# Patient Record
Sex: Female | Born: 2005 | Hispanic: No | Marital: Single | Smoking: Never smoker
Health system: Southern US, Community
[De-identification: ages and names within clinical notes are randomized; demographics above are authoritative.]

---
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
## 2024-05-10 ENCOUNTER — Ambulatory Visit (HOSPITAL_COMMUNITY)
Admission: EM | Admit: 2024-05-10 | Discharge: 2024-05-12 | Disposition: A | Discharge status: Other Acute Inpatient Hospital | Source: Home / Self Care

## 2024-05-10 DIAGNOSIS — F23 Brief psychotic disorder: Secondary | ICD-10-CM | POA: Insufficient documentation

## 2024-05-10 DIAGNOSIS — E669 Obesity, unspecified: Secondary | ICD-10-CM | POA: Diagnosis not present

## 2024-05-10 DIAGNOSIS — F32A Depression, unspecified: Secondary | ICD-10-CM | POA: Diagnosis not present

## 2024-05-10 DIAGNOSIS — Z79899 Other long term (current) drug therapy: Secondary | ICD-10-CM | POA: Insufficient documentation

## 2024-05-10 DIAGNOSIS — R440 Auditory hallucinations: Secondary | ICD-10-CM

## 2024-05-10 DIAGNOSIS — F29 Unspecified psychosis not due to a substance or known physiological condition: Secondary | ICD-10-CM

## 2024-05-10 DIAGNOSIS — R45851 Suicidal ideations: Secondary | ICD-10-CM | POA: Diagnosis not present

## 2024-05-10 DIAGNOSIS — F69 Unspecified disorder of adult personality and behavior: Secondary | ICD-10-CM

## 2024-05-10 DIAGNOSIS — F2081 Schizophreniform disorder: Secondary | ICD-10-CM | POA: Diagnosis not present

## 2024-05-10 MED ORDER — OLANZAPINE 5 MG PO TABS
5.0000 mg | ORAL_TABLET | ORAL | Status: AC
  Administered 2024-05-11: 5 mg via ORAL
  Filled 2024-05-10: qty 1

## 2024-05-10 MED ORDER — MAGNESIUM HYDROXIDE 400 MG/5ML PO SUSP: 30.0000 mL | Freq: Every day | ORAL | Status: DC | PRN

## 2024-05-10 MED ORDER — HALOPERIDOL 5 MG PO TABS: 5.0000 mg | ORAL_TABLET | Freq: Three times a day (TID) | ORAL | Status: DC | PRN

## 2024-05-10 MED ORDER — OLANZAPINE 10 MG IM SOLR: 5.0000 mg | Freq: Three times a day (TID) | INTRAMUSCULAR | Status: DC | PRN

## 2024-05-10 MED ORDER — DIPHENHYDRAMINE HCL 50 MG PO CAPS: 50.0000 mg | ORAL_CAPSULE | Freq: Three times a day (TID) | ORAL | Status: DC | PRN

## 2024-05-10 MED ORDER — ALUM & MAG HYDROXIDE-SIMETH 200-200-20 MG/5ML PO SUSP: 30.0000 mL | ORAL | Status: DC | PRN

## 2024-05-10 MED ORDER — OLANZAPINE 10 MG IM SOLR: 10.0000 mg | Freq: Three times a day (TID) | INTRAMUSCULAR | Status: DC | PRN

## 2024-05-10 MED ORDER — ACETAMINOPHEN 325 MG PO TABS: 650.0000 mg | ORAL_TABLET | Freq: Four times a day (QID) | ORAL | Status: DC | PRN

## 2024-05-10 NOTE — ED Provider Notes (Signed)
 [facility] Continuous Assessment Admission H&P  Date: 05/10/24 Patient Name: Patricia Branch MRN: [id number] Chief Complaint: auditory hallucination   Diagnoses:  Final diagnoses:  Auditory hallucinations  Brief reactive psychosis (HCC)  Behavior concern in adult    HPI: Patricia Branch, 18 y/o female with no confirm psychiatric diagnosis.  Presented to [facility] [facility] by her father [name]).  Patient is observed in the interview room sitting in the chair, patient scanning the room and talking to herself.  When asked a question patient stated I have this problem I cannot talk because they will not let me talk, patient went on to say that I have been getting spirits and my body and the demons are talking to me but they will not allow me to talk sometimes.  When asked any questions patient would talk in the third person and at times patient was states do not talk to him do not tell him anything.  Patient obviously has experienced some brief psychosis and auditory hallucination patient is even responding to internal stimuli.  At one point patient stated there are a lot of  things going on in the neighborhood and I cannot talk about it.  They will not let me talk.  Collateral : Per patient's father patient has been acting like this for the past 30 days or so at first he stated that they did not understand what was going on and he thought that the patient was acting out based on something they see on to talk or Instagram but it became more apparent that the patient is hallucinating at times and the patient keep talking more about demons and devils.  According to the patient's father patient is currently not seeing a psychiatrist or therapist and is not diagnosed with anything right now.  Per the patient's father he did know that the patient's mother was thinking about taking the patient to a doctor but he is not sure if they did follow up with that appointment.  Patient currently lives at home with  mother and father and other siblings.   Face-to-face observation of patient, patient is alert and oriented to person and place and time.  Patient is fairly groomed.  Patient is currently hallucinating and responding to internal stimuli patient can be observed scanning the room and talking to herself and when asked questions patient would talk in the third person's voice  do not talk to him do not say anything.  Patient currently denies SI, HI.  According to patient she does not drink or smoke.  Patient does not appear to be in any immediate distress does not show signs of wanting to hurt herself.  However patient is influenced by internal stimuli.  Writer discussed with both patient and her father that we will admit her so we can conduct further evaluation and assessment.  Father and patient was given the opportunity to ask questions questions were answered.  recommending patient admission when a bed becomes available for now patient will stay in the observation unit Total Time spent with patient: 30 minutes  Musculoskeletal  Strength & Muscle Tone: within normal limits Gait & Station: normal Patient leans: N/A  Psychiatric Specialty Exam  Presentation General Appearance:  Casual  Eye Contact: Good  Speech: Clear and Coherent  Speech Volume: Decreased  Handedness: Right   Mood and Affect  Mood: Dysphoric  Affect: Flat; Labile   Thought Process  Thought Processes: Linear  Descriptions of Associations:Loose  Orientation:Full (Time, Place and Person)  Thought  Content:Paranoid Ideation  Diagnosis of Schizophrenia or Schizoaffective disorder in past: No   Hallucinations:Hallucinations: Auditory Description of Auditory Hallucinations: According to the patient she is hearing demons talking to her  Ideas of Reference:Paranoia  Suicidal Thoughts:Suicidal Thoughts: No  Homicidal Thoughts:Homicidal Thoughts: No   Sensorium  Memory: Immediate  Fair  Judgment: Poor  Insight: Poor   Executive Functions  Concentration: Poor  Attention Span: Fair  Recall: Fair  Fund of Knowledge: Fair  Language: Fair   Psychomotor Activity  Psychomotor Activity: Psychomotor Activity: Normal   Assets  Assets: Desire for Improvement   Sleep  Sleep: Sleep: Fair Number of Hours of Sleep: 6   Nutritional Assessment (For OBS and FBC admissions only) Has the patient had a weight loss or gain of 10 pounds or more in the last 3 months?: No Has the patient had a decrease in food intake/or appetite?: No Does the patient have dental problems?: No Does the patient have eating habits or behaviors that may be indicators of an eating disorder including binging or inducing vomiting?: No Has the patient recently lost weight without trying?: 0 Has the patient been eating poorly because of a decreased appetite?: 0 Malnutrition Screening Tool Score: 0    Physical Exam HENT:     Head: Normocephalic.     Nose: Nose normal.  Eyes:     Pupils: Pupils are equal, round, and reactive to light.  Cardiovascular:     Rate and Rhythm: Normal rate.  Pulmonary:     Effort: Pulmonary effort is normal.  Musculoskeletal:        General: Normal range of motion.     Cervical back: Normal range of motion.  Neurological:     General: No focal deficit present.     Mental Status: She is alert.  Psychiatric:        Mood and Affect: Mood normal.        Behavior: Behavior normal.        Thought Content: Thought content normal.        Judgment: Judgment normal.    Review of Systems  Constitutional: Negative.   HENT: Negative.    Eyes: Negative.   Respiratory: Negative.    Cardiovascular: Negative.   Gastrointestinal: Negative.   Genitourinary: Negative.   Musculoskeletal: Negative.   Skin: Negative.   Neurological: Negative.   Psychiatric/Behavioral:  Positive for hallucinations. The patient is nervous/anxious.     There were no  vitals taken for this visit. There is no height or weight on file to calculate BMI.  Past Psychiatric History: Auditory hallucination  Is the patient at risk to self? No  Has the patient been a risk to self in the past 6 months? No .    Has the patient been a risk to self within the distant past? No   Is the patient a risk to others? No   Has the patient been a risk to others in the past 6 months? No   Has the patient been a risk to others within the distant past? No   Past Medical History: See chart  Family History: Unknown  Social History: Denies  Last Labs:  No visits with results within 6 Month(s) from this visit.  Latest known visit with results is:  Admission on 06/04/2020, Discharged on 06/04/2020  Component Date Value Ref Range Status   Lipase 06/03/2020 24  11 - 51 U/L Final   Performed at [facility], [address], [place], [place] [address]  Sodium 06/03/2020 138  135 - 145 mmol/L Final   Potassium 06/03/2020 3.4 (L)  3.5 - 5.1 mmol/L Final   Chloride 06/03/2020 100  98 - 111 mmol/L Final   CO2 06/03/2020 27  22 - 32 mmol/L Final   Glucose, Bld 06/03/2020 89  70 - 99 mg/dL Final   Glucose reference range applies only to samples taken after fasting for at least 8 hours.   BUN 06/03/2020 14  4 - 18 mg/dL Final   Creatinine, Ser 06/03/2020 0.69  0.50 - 1.00 mg/dL Final   Calcium 91/68/7978 8.9  8.9 - 10.3 mg/dL Final   Total Protein 91/68/7978 7.7  6.5 - 8.1 g/dL Final   Albumin 91/68/7978 3.9  3.5 - 5.0 g/dL Final   AST 91/68/7978 17  15 - 41 U/L Final   ALT 06/03/2020 11  0 - 44 U/L Final   Alkaline Phosphatase 06/03/2020 72  50 - 162 U/L Final   Total Bilirubin 06/03/2020 0.7  0.3 - 1.2 mg/dL Final   GFR calc non Af Amer 06/03/2020 NOT CALCULATED  >60 mL/min Final   GFR calc Af Amer 06/03/2020 NOT CALCULATED  >60 mL/min Final   Anion gap 06/03/2020 11  5 - 15 Final   Performed at [facility], [address] [place], [place] [address]    WBC 06/03/2020 10.5  4.5 - 13.5 K/uL Final   RBC 06/03/2020 4.91  3.80 - 5.20 MIL/uL Final   Hemoglobin 06/03/2020 10.5 (L)  11.0 - 14.6 g/dL Final   HCT 91/68/7978 34.2  33.0 - 44.0 % Final   MCV 06/03/2020 69.7 (L)  77.0 - 95.0 fL Final   MCH 06/03/2020 21.4 (L)  25.0 - 33.0 pg Final   MCHC 06/03/2020 30.7 (L)  31.0 - 37.0 g/dL Final   RDW 91/68/7978 17.2 (H)  11.3 - 15.5 % Final   Platelets 06/03/2020 433 (H)  150 - 400 K/uL Final   nRBC 06/03/2020 0.0  0.0 - 0.2 % Final   Performed at [facility], [address] [place], [place] [address]   Troponin I (High Sensitivity) 06/03/2020 3  <18 ng/L Final   Comment: (NOTE) Elevated high sensitivity troponin I (hsTnI) values and significant  changes across serial measurements may suggest ACS but many other  chronic and acute conditions are known to elevate hsTnI results.  Refer to the Links section for chest pain algorithms and additional  guidance. Performed at [facility], [address] [place], [place] [address]     Allergies: Patient has no known allergies.  Medications:     Medical Decision Making  Observation unit    Recommendations  Based on my evaluation the patient does not appear to have an emergency medical condition.  Gaither Pouch, NP 05/10/24  10:36 PM

## 2024-05-10 NOTE — Progress Notes (Signed)
   05/10/24 2050  [facility] Triage Screening (Walk-ins at [facility] only)  How Did You Hear About Us ? Family/Friend  What Is the Reason for Your Visit/Call Today? Patricia Branch is a 18 year old female presenting as a voluntary walk-in to [facility] due to SI and hallucinations. Patient denied HI and alcohol/drug usage. Patient reports today wanted to attempt suicide, its like I have moments of where I am just going to kill myself, the demons don't want me to tell you. Patient states there are demons telling me to hurt people, they don't want me to talk, they are walking around my neighborhood possessing people, I got possessed today, they want you to think I am crazy, but I am not. Patient is responding to internal stimuli looking around the room stating they don't want me to talk. Patient reports compliance to psych medication, however, she feels the medication is not working.  How Long Has This Been Causing You Problems? 1 wk - 1 month  Have You Recently Had Any Thoughts About Hurting Yourself? Yes  How long ago did you have thoughts about hurting yourself? today  Are You Planning to Commit Suicide/Harm Yourself At This time? Yes (don't know)  Have you Recently Had Thoughts About Hurting Someone [place]? No  Are You Planning To Harm Someone At This Time? No  Physical Abuse Denies  Verbal Abuse Denies  Sexual Abuse Denies  Exploitation of patient/patient's resources Denies  Self-Neglect Denies  Possible abuse reported to:  (n/a)  Are you currently experiencing any auditory, visual or other hallucinations? Yes  Please explain the hallucinations you are currently experiencing: hearing and seeing demons  Have You Used Any Alcohol or Drugs in the Past 24 Hours? No  Do you have any current medical co-morbidities that require immediate attention? No  Clinician description of patient physical appearance/behavior: neat / cooperative  What Do You Feel Would Help You the Most Today? Treatment for Depression or  other mood problem  If access to [facility] was not available, would you have sought care in the Emergency Department? Yes  Determination of Need Urgent (48 hours)  Options For Referral Inpatient Hospitalization;Medication Management;BH Urgent Care;Outpatient Therapy  Determination of Need filed? Yes    Flowsheet Row ED from 05/10/2024 in [facility]  C-SSRS RISK CATEGORY High Risk

## 2024-05-10 NOTE — BH Assessment (Signed)
 Comprehensive Clinical Assessment (CCA) Note  05/10/2024 Patricia Branch [id number]  Disposition: Gaither Pouch, NP, recommends inpatient treatment. AC at [facility] to review for bed placement.   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 Acute risk factors for suicide include: N/A. Protective factors for this patient include: responsibility to others (children, family) and hope for the future. Considering these factors, the overall suicide risk at this point appears to be moderate. Patient is not appropriate for outpatient follow up.  Patricia Branch is a 18 year old female presenting as a voluntary walk-in to [facility] due to SI and hallucinations. Patient denied HI and alcohol/drug usage. Patient was brought in by her father, Patricia Branch. Patient reports today wanted to attempt suicide, its like I have moments of where I am just going to kill myself, the demons don't want me to tell you. Patient states there are demons telling me to hurt people, they don't want me to talk, they are walking around my neighborhood possessing people, I got possessed today, they want you to think I am crazy, but I am not. Patient is responding to internal stimuli looking around the room stating they don't want me to talk. Patient denied having a therapist and a psychiatrist. Patient reports she does receive psych medications from her a doctor, maybe a PCP. Patient feels that her medications are not working. Patient denied prior psych hospitalizations, suicide attempts and self-harming behaviors. Patient resides with mother, father and 2 siblings. Patient is unemployed. Patient denied access to guns. Patient was cooperative during assessment.  Chief Complaint:  Chief Complaint  Patient presents with   Delusional   Visit Diagnosis:  Hallucinations    CCA Screening, Triage and Referral (STR)  Patient Reported  Information How did you hear about us ? Family/Friend  What Is the Reason for Your Visit/Call Today? Patricia Branch is a 18 year old female presenting as a voluntary walk-in to [facility] due to SI and hallucinations. Patient denied HI and alcohol/drug usage. Patient reports today wanted to attempt suicide, its like I have moments of where I am just going to kill myself, the demons don't want me to tell you. Patient states there are demons telling me to hurt people, they don't want me to talk, they are walking around my neighborhood possessing people, I got possessed today, they want you to think I am crazy, but I am not. Patient is responding to internal stimuli looking around the room stating they don't want me to talk. Patient reports compliance to psych medication, however, she feels the medication is not working.  How Long Has This Been Causing You Problems? 1 wk - 1 month  What Do You Feel Would Help You the Most Today? Treatment for Depression or other mood problem   Have You Recently Had Any Thoughts About Hurting Yourself? Yes  Are You Planning to Commit Suicide/Harm Yourself At This time? Yes (don't know)   Flowsheet Row ED from 05/10/2024 in [facility]  C-SSRS RISK CATEGORY High Risk    Have you Recently Had Thoughts About Hurting Someone Patricia Branch? No  Are You Planning to Harm Someone at This Time? No  Explanation: n/a   Have You Used Any Alcohol or Drugs in the Past 24 Hours? No  How Long Ago Did You Use Drugs or Alcohol? N/a What Did You Use and How Much? N/a  Do You Currently Have a Therapist/Psychiatrist? No (states PCP prescribes psych  medications)  Name of Therapist/Psychiatrist:  n/a  Have You Been Recently Discharged From Any Office Practice or Programs? No  Explanation of Discharge From Practice/Program: n/a    CCA Screening Triage Referral Assessment Type of Contact: Face-to-Face  Telemedicine Service Delivery:  n/a Is this  Initial or Reassessment?  N/a Date Telepsych consult ordered in [facility]:   N/a Time Telepsych consult ordered in [facility]:   N/a Location of Assessment: [facility] Assessment Services  Provider Location: [facility] Assessment Services   Collateral Involvement: none   Does Patient Have a Automotive engineer Guardian? No  Legal Guardian Contact Information: n/a  Copy of Legal Guardianship Form: -- (n/a)  Legal Guardian Notified of Arrival: -- (n/a)  Legal Guardian Notified of Pending Discharge: -- (n/a)  If Minor and Not Living with Parent(s), Who has Custody? n/a  Is CPS involved or ever been involved? Never  Is APS involved or ever been involved? Never   Patient Determined To Be At Risk for Harm To Self or Others Based on Review of Patient Reported Information or Presenting Complaint? Yes, for Self-Harm  Method: Plan with intent and identified person  Availability of Means: No access or NA  Intent: Vague intent or NA  Notification Required: No need or identified person  Additional Information for Danger to Others Potential: Active psychosis  Additional Comments for Danger to Others Potential: n/a  Are There Guns or Other Weapons in Your Home? No  Types of Guns/Weapons: n/a  Are These Weapons Safely Secured?                            -- (n/a)  Who Could Verify You Are Able To Have These Secured: n/a  Do You Have any Outstanding Charges, Pending Court Dates, Parole/Probation? none reported  Contacted To Inform of Risk of Harm To Self or Others: Family/Significant Other:   Does Patient Present under Involuntary Commitment? No   [place] of Residence: [place]   Patient Currently Receiving the Following Services: Not Receiving Services   Determination of Need: Urgent (48 hours)   Options For Referral: Inpatient Hospitalization; Medication Management; BH Urgent Care; Outpatient Therapy   CCA Biopsychosocial Patient Reported Schizophrenia/Schizoaffective Diagnosis in  Past: No   Strengths: uta   Mental Health Symptoms Depression:  Hopelessness; Difficulty Concentrating   Duration of Depressive symptoms: Duration of Depressive Symptoms: -- (uta)   Mania:  None   Anxiety:   Worrying; Tension; Restlessness; Difficulty concentrating   Psychosis:  Hallucinations   Duration of Psychotic symptoms: Duration of Psychotic Symptoms: N/A   Trauma:  -- (uta)   Obsessions:  Recurrent & persistent thoughts/impulses/images   Compulsions:  None   Inattention:  None   Hyperactivity/Impulsivity:  None   Oppositional/Defiant Behaviors:  None   Emotional Irregularity:  None   Other Mood/Personality Symptoms:  n/a    Mental Status Exam Appearance and self-care  Stature:  Average   Weight:  Average weight   Clothing:  Age-appropriate   Grooming:  Normal   Cosmetic use:  None   Posture/gait:  Normal   Motor activity:  Not Remarkable   Sensorium  Attention:  Normal   Concentration:  Preoccupied   Orientation:  X5   Recall/memory:  Normal   Affect and Mood  Affect:  Anxious   Mood:  Anxious   Relating  Eye contact:  Normal   Facial expression:  Anxious   Attitude toward examiner:  Cooperative   Thought  and Language  Speech flow: Normal   Thought content:  Delusions   Preoccupation:  Other (Comment)   Hallucinations:  Auditory; Visual   Organization:  Disorganized   Company secretary of Knowledge:  Average   Intelligence:  Average   Abstraction:  Functional   Judgement:  Impaired   Reality Testing:  Distorted   Insight:  Lacking   Decision Making:  Impulsive   Social Functioning  Social Maturity:  Impulsive   Social Judgement:  Naive   Stress  Stressors:  Other (Comment) (demons)   Coping Ability:  Overwhelmed; Exhausted   Skill Deficits:  Self-control; Decision making   Supports:  Family     Religion: Religion/Spirituality Are You A Religious Person?: No How Might This Affect  Treatment?: n/a  Leisure/Recreation: Leisure / Recreation Do You Have Hobbies?: Yes Leisure and Hobbies: drawing and researching information online  Exercise/Diet: Exercise/Diet Do You Exercise?: No Have You Gained or Lost A Significant Amount of Weight in the Past Six Months?: No Do You Follow a Special Diet?: No Do You Have Any Trouble Sleeping?:  (uta)   CCA Employment/Education Employment/Work Situation: Employment / Work Situation Employment Situation: Unemployed Patient's Job has Been Impacted by Current Illness: No Has Patient ever Been in Equities trader?: No  Education: Education Is Patient Currently Attending School?: No Last Grade Completed: 12 Did You Product manager?: No Did You Have An Individualized Education Program (IIEP): No Did You Have Any Difficulty At [employer]?: No Patient's Education Has Been Impacted by Current Illness: No   CCA Family/Childhood History Family and Relationship History: Family history Marital status: Single Does patient have children?: No  Childhood History:  Childhood History By whom was/is the patient raised?: Mother, Father Did patient suffer any verbal/emotional/physical/sexual abuse as a child?:  (uta) Did patient suffer from severe childhood neglect?:  (uta) Has patient ever been sexually abused/assaulted/raped as an adolescent or adult?:  (uta) Was the patient ever a victim of a crime or a disaster?:  (uta) Witnessed domestic violence?:  (uta) Has patient been affected by domestic violence as an adult?:  (uta)   CCA Substance Use Alcohol/Drug Use: Alcohol / Drug Use Pain Medications: see MAR Prescriptions: see MAR Over the Counter: see MAR History of alcohol / drug use?: No history of alcohol / drug abuse Longest period of sobriety (when/how long): n/a Negative Consequences of Use:  (n/a) Withdrawal Symptoms:  (n/a)     ASAM's:  Six Dimensions of Multidimensional Assessment  Dimension 1:  Acute Intoxication and/or  Withdrawal Potential:   Dimension 1:  Description of individual's past and current experiences of substance use and withdrawal: n/a  Dimension 2:  Biomedical Conditions and Complications:   Dimension 2:  Description of patient's biomedical conditions and  complications: n/a  Dimension 3:  Emotional, Behavioral, or Cognitive Conditions and Complications:  Dimension 3:  Description of emotional, behavioral, or cognitive conditions and complications: n/a  Dimension 4:  Readiness to Change:  Dimension 4:  Description of Readiness to Change criteria: n/a  Dimension 5:  Relapse, Continued use, or Continued Problem Potential:  Dimension 5:  Relapse, continued use, or continued problem potential critiera description: n/a  Dimension 6:  Recovery/Living Environment:  Dimension 6:  Recovery/Iiving environment criteria description: n/a  ASAM Severity Score:    ASAM Recommended Level of Treatment: ASAM Recommended Level of Treatment:  (n/a)   Substance use Disorder (SUD) Substance Use Disorder (SUD)  Checklist Symptoms of Substance Use:  (n/a)  Recommendations for Services/Supports/Treatments: Recommendations  for Services/Supports/Treatments Recommendations For Services/Supports/Treatments: Inpatient Hospitalization, Individual Therapy, Medication Management, Other (Comment)  Disposition Recommendation per psychiatric provider:  Recommends inpatient psychiatric treatment.    DSM5 Diagnoses: There are no active problems to display for this patient.    Referrals to Alternative Service(s): Referred to Alternative Service(s):   Place:   Date:   Time:    Referred to Alternative Service(s):   Place:   Date:   Time:    Referred to Alternative Service(s):   Place:   Date:   Time:    Referred to Alternative Service(s):   Place:   Date:   Time:     Patricia Branch, [facility]

## 2024-05-11 ENCOUNTER — Other Ambulatory Visit: Payer: Self-pay

## 2024-05-11 LAB — COMPREHENSIVE METABOLIC PANEL WITH GFR
ALT: 18 U/L (ref 0–44)
AST: 20 U/L (ref 15–41)
Albumin: 3.6 g/dL (ref 3.5–5.0)
Alkaline Phosphatase: 70 U/L (ref 38–126)
Anion gap: 13 (ref 5–15)
BUN: 12 mg/dL (ref 6–20)
CO2: 25 mmol/L (ref 22–32)
Calcium: 9.4 mg/dL (ref 8.9–10.3)
Chloride: 105 mmol/L (ref 98–111)
Creatinine, Ser: 0.76 mg/dL (ref 0.44–1.00)
GFR, Estimated: >60 mL/min (ref >60)
Glucose, Bld: 77 mg/dL (ref 70–99)
Potassium: 4.1 mmol/L (ref 3.5–5.1)
Sodium: 143 mmol/L (ref 135–145)
Total Bilirubin: 0.8 mg/dL (ref 0.0–1.2)
Total Protein: 7.4 g/dL (ref 6.5–8.1)

## 2024-05-11 LAB — CBC WITH DIFFERENTIAL/PLATELET
Abs Immature Granulocytes: 0.02 K/uL (ref 0.00–0.07)
Basophils Absolute: 0.1 K/uL (ref 0.0–0.1)
Basophils Relative: 1 %
Eosinophils Absolute: 0.1 K/uL (ref 0.0–0.5)
Eosinophils Relative: 1 %
HCT: 35.8 % — ABNORMAL LOW (ref 36.0–46.0)
Hemoglobin: 10.9 g/dL — ABNORMAL LOW (ref 12.0–15.0)
Immature Granulocytes: 0 %
Lymphocytes Relative: 20 %
Lymphs Abs: 2 K/uL (ref 0.7–4.0)
MCH: 22.3 pg — ABNORMAL LOW (ref 26.0–34.0)
MCHC: 30.4 g/dL (ref 30.0–36.0)
MCV: 73.2 fL — ABNORMAL LOW (ref 80.0–100.0)
Monocytes Absolute: 0.8 K/uL (ref 0.1–1.0)
Monocytes Relative: 9 %
Neutro Abs: 6.7 K/uL (ref 1.7–7.7)
Neutrophils Relative %: 69 %
Platelets: 449 K/uL — ABNORMAL HIGH (ref 150–400)
RBC: 4.89 MIL/uL (ref 3.87–5.11)
RDW: 16.9 % — ABNORMAL HIGH (ref 11.5–15.5)
WBC: 9.6 K/uL (ref 4.0–10.5)
nRBC: 0 % (ref 0.0–0.2)

## 2024-05-11 LAB — POCT URINE DRUG SCREEN - MANUAL ENTRY (I-SCREEN)
POC Amphetamine UR: NOT DETECTED
POC Buprenorphine (BUP): NOT DETECTED
POC Cocaine UR: NOT DETECTED
POC Marijuana UR: NOT DETECTED
POC Methadone UR: NOT DETECTED
POC Methamphetamine UR: NOT DETECTED
POC Morphine: NOT DETECTED
POC Oxazepam (BZO): NOT DETECTED
POC Oxycodone UR: NOT DETECTED
POC Secobarbital (BAR): NOT DETECTED

## 2024-05-11 LAB — POC URINE PREG, ED: Preg Test, Ur: NEGATIVE

## 2024-05-11 LAB — ETHANOL: Alcohol, Ethyl (B): <15 mg/dL (ref <15)

## 2024-05-11 LAB — TSH: TSH: 3.795 u[IU]/mL (ref 0.350–4.500)

## 2024-05-11 MED ORDER — OLANZAPINE 5 MG PO TBDP
5.0000 mg | ORAL_TABLET | Freq: Two times a day (BID) | ORAL | Status: DC
  Administered 2024-05-11: 5 mg via ORAL
  Filled 2024-05-11: qty 1

## 2024-05-11 MED ORDER — OLANZAPINE 5 MG PO TBDP
5.0000 mg | ORAL_TABLET | Freq: Once | ORAL | Status: AC
  Administered 2024-05-11: 5 mg via ORAL
  Filled 2024-05-11: qty 1

## 2024-05-11 NOTE — Progress Notes (Addendum)
 Pt is actively responding to internal stimuli and gazing at the ceiling while taking to self. No signs of acute distress noted. Staff will monitor for pt's safety.

## 2024-05-11 NOTE — Progress Notes (Signed)
 Pt is awoke and alert with delay responses. Pt did not voice any complaints of pain or discomfort. No signs of acute distress noted. Pt endorses passive SI,no plan or intent. Pt reported that the voices in her head tell her to kill herself and sometimes tell her to kill others. Pt was advised to notify staff when having overwhelming thoughts to to hurt self or others. Pt verbalized understanding. Staff will monitor for pt's safety.

## 2024-05-11 NOTE — ED Notes (Signed)
 Attempted to get labs from pt, pt refused.

## 2024-05-11 NOTE — ED Notes (Signed)
 Pt was cooperative on arrival to the unit. Pt appeared to be internally preoccupied, had delayed responses to some of the questions. Pt said she came in because she had SI and was hearing voices of the demons that she claims possessed her today. Pt said the demons are going around possessing people. Pt's skin assessment was done, no significant findings noted. She was oriented to the unit and her belongings secured per hospital protocol. Staff will continue to monitor pt for safety.

## 2024-05-11 NOTE — ED Provider Notes (Signed)
 [facility] ASAP Discharge Summary  Date and Time: 05/11/2024 9:37 AM  Name: Patricia Branch  MRN:  [id number]   Discharge Diagnoses:  Final diagnoses:  Auditory hallucinations  Brief reactive psychosis (HCC)  Behavior concern in adult   HPI: Patricia Branch is a 18 y.o. female with no significant past psychiatric history who presents with auditory hallucinations and ideas of reference about spirits and demons possessing her body.  Subjective:  Patient reports that she is still experiencing a lot of hallucinations.  Patient denies any substance use which corresponded with hide hallucinations.  Patient reports that she feels overwhelmed by the voices and that they are lying to her and telling her things that she does not agree with.  Patient was agreeable to going to inpatient psychiatry.  Stay Summary: Patient was observed to be having conversations with herself.  Patient appeared somewhat paranoid around others.  Labs came back unremarkable except low hemoglobin 11 consistent with previous baseline.  Patient consented to voluntary inpatient treatment.  Total Time spent with patient: 30 minutes  Past Psychiatric History: No formal past psychiatric history Past Medical History: No pertinent history Family History: No pertinent Family Psychiatric History: No pertinent Social History: Lives with parents in [place], further history limited by patient's psychosis Tobacco Cessation:  N/A, patient does not currently use tobacco products  Current Medications:  Current Facility-Administered Medications  Medication Dose Route Frequency Provider Last Rate Last Admin   acetaminophen  (TYLENOL ) tablet 650 mg  650 mg Oral Q6H PRN Trudy Carwin, NP       alum & mag hydroxide-simeth (MAALOX/MYLANTA) 200-200-20 MG/5ML suspension 30 mL  30 mL Oral Q4H PRN Trudy Carwin, NP       haloperidol  (HALDOL ) tablet 5 mg  5 mg Oral TID PRN Trudy Carwin, NP       And   diphenhydrAMINE  (BENADRYL ) capsule 50 mg  50 mg  Oral TID PRN Trudy Carwin, NP       magnesium  hydroxide (MILK OF MAGNESIA) suspension 30 mL  30 mL Oral Daily PRN Trudy Carwin, NP       OLANZapine  (ZYPREXA ) injection 10 mg  10 mg Intramuscular TID PRN Trudy Carwin, NP       OLANZapine  (ZYPREXA ) injection 5 mg  5 mg Intramuscular TID PRN Trudy Carwin, NP       Current Outpatient Medications  Medication Sig Dispense Refill   Lurasidone HCl 60 MG TABS Take 60 mg by mouth at bedtime.     amphetamine-dextroamphetamine (ADDERALL XR) 15 MG 24 hr capsule Take 15 mg by mouth daily. (Patient not taking: Reported on 05/11/2024)     clonazePAM (KLONOPIN) 0.5 MG tablet Take 0.5 mg by mouth daily. (Patient not taking: Reported on 05/11/2024)      PTA Medications:  Facility Ordered Medications  Medication   acetaminophen  (TYLENOL ) tablet 650 mg   alum & mag hydroxide-simeth (MAALOX/MYLANTA) 200-200-20 MG/5ML suspension 30 mL   magnesium  hydroxide (MILK OF MAGNESIA) suspension 30 mL   haloperidol  (HALDOL ) tablet 5 mg   And   diphenhydrAMINE  (BENADRYL ) capsule 50 mg   OLANZapine  (ZYPREXA ) injection 5 mg   OLANZapine  (ZYPREXA ) injection 10 mg   [COMPLETED] OLANZapine  (ZYPREXA ) tablet 5 mg   PTA Medications  Medication Sig   Lurasidone HCl 60 MG TABS Take 60 mg by mouth at bedtime.   amphetamine-dextroamphetamine (ADDERALL XR) 15 MG 24 hr capsule Take 15 mg by mouth daily. (Patient not taking: Reported on 05/11/2024)   clonazePAM (KLONOPIN) 0.5 MG tablet Take 0.5 mg by  mouth daily. (Patient not taking: Reported on 05/11/2024)        No data to display          Flowsheet Row ED from 05/10/2024 in [facility]  C-SSRS RISK CATEGORY High Risk    Musculoskeletal  Strength & Muscle Tone: within normal limits Gait & Station: normal Patient leans: N/A  Psychiatric Specialty Exam  Presentation  General Appearance:  Casual  Eye Contact: Good  Speech: Clear and Coherent  Speech  Volume: Decreased  Handedness: Right   Mood and Affect  Mood: Dysphoric  Affect: Flat; Labile   Thought Process  Thought Processes: Linear  Descriptions of Associations:Loose  Orientation:Full (Time, Place and Person)  Thought Content:Paranoid Ideation  Diagnosis of Schizophrenia or Schizoaffective disorder in past: No    Hallucinations:Hallucinations: Auditory Description of Auditory Hallucinations: According to the patient she is hearing demons talking to her  Ideas of Reference:Paranoia  Suicidal Thoughts:Suicidal Thoughts: No  Homicidal Thoughts:Homicidal Thoughts: No   Sensorium  Memory: Immediate Fair  Judgment: Poor  Insight: Poor   Executive Functions  Concentration: Poor  Attention Span: Fair  Recall: Fair  Fund of Knowledge: Fair  Language: Fair   Psychomotor Activity  Psychomotor Activity: Psychomotor Activity: Normal   Assets  Assets: Desire for Improvement   Sleep  Sleep: Sleep: Fair  No Safety Checks orders active in given range  Nutritional Assessment (For OBS and [facility] admissions only) Has the patient had a weight loss or gain of 10 pounds or more in the last 3 months?: No Has the patient had a decrease in food intake/or appetite?: No Does the patient have dental problems?: No Does the patient have eating habits or behaviors that may be indicators of an eating disorder including binging or inducing vomiting?: No Has the patient recently lost weight without trying?: 0 Has the patient been eating poorly because of a decreased appetite?: 0 Malnutrition Screening Tool Score: 0    Physical Exam  Physical Exam Vitals and nursing note reviewed.  Constitutional:      General: She is not in acute distress. HENT:     Head: Normocephalic and atraumatic.  Pulmonary:     Effort: Pulmonary effort is normal.  Neurological:     Mental Status: She is alert.     Comments: No gross cranial nerve deficits. Strength  symmetric and equal and all 4 extremities. Gait normal when walking. No stiffness with passive ROS. Further exam limited by psychosis and not following directions.   Psychiatric:     Comments: No obvious EPS.      Review of Systems  Constitutional:  Negative for fever.  Cardiovascular:  Negative for chest pain and palpitations.  Gastrointestinal:  Negative for constipation, diarrhea, nausea and vomiting.  Neurological:  Negative for dizziness, weakness and headaches.  Psychiatric/Behavioral:         Pt denies extrapyramidal symptoms including dystonia (sudden spastic contractions of muscle groups), parkinsonism (bradykinesia, tremors, rigidity), and akathisia (severe restlessness).    Blood pressure 131/70, pulse 70, temperature 97.6 F (36.4 C), resp. rate 17, SpO2 100%. There is no height or weight on file to calculate BMI.  Demographic Factors:  Adolescent or young adult  Loss Factors: NA  Historical Factors: NA  Risk Reduction Factors:   Living with another person, especially a relative and Positive social support  Continued Clinical Symptoms:  Psychosis which began about month ago.   Cognitive Features That Contribute To Risk:  None    Suicide Risk:  Minimal: No identifiable suicidal ideation.  Patients presenting with no risk factors and no significant depressive symptoms. Pt is experiencing ego dystonic hallucinations and delusions around spirits and demons being in her but do not feel that this contributes to significant suicide risk and pt has good social support at home and is recommend for inpatient psychiatry to stabilize.    Plan Of Care/Follow-up recommendations:  Jordane Hisle is a 18 y.o. female with no past psychiatric history presenting with first episode psychosis including auditory hallucinations, delusions of spirits and demons in her body, and paranoid ideation.  Pt was trying to leave [facility] due to her AH telling her to leave, so we will continue with IVC  for patient safety as she is unable care for self and would be imminent harm to self if leaving hospital with appropriate workup and treatment.  CMP unremarkable. CBC low hemoglobin and recommend follow up with PCP. Given first episode psychosis, doing additional labs to rule out lesser common medical etiology with neuropsychiatric manifestations. Given normal neuro exam does not need transfer to ED for CT scan currently but recommend this outpatient if indicated. Please continue to perform neurologic exams to rule out any medical causes. First episode labs were determined based on Pediatric guidelines for psychosis in adolescent / young adults (reference below). Labs included CBC, CMP, B12, folate, ESR, ANA, RPR, HIV. Discuss with family any concerns for heavy metal exposure or family history of wilsons disease or other severe genetic conditions. Pt will transfer to inpatient psychiatry this evening. Holding home lurasidone in favor of high potency antipsychotic but reasonable to transition back to this medication. Pt could benefit from long acting injectable. Also holding adderal as this could worsen/trigger psychosis, although she has been on for a long time. Patient was initiated on olanzapine  5 mg at bedtime without side effects and will continue this for now until assessed at hospital and discussing options with pt when more stable.   Disposition: [facility] pending bed availability this evening.   Justino Cornish, MD PGY-2 Psychiatry Resident 05/11/2024, 9:54 AM

## 2024-05-11 NOTE — Discharge Instructions (Addendum)
 Follow-up recommendations:  Activity:  Normal, as tolerated Diet:  Per PCP recommendation  Patient is instructed prior to discharge to:  Take all medications as prescribed by mental healthcare provider. Report any adverse effects and/or reactions from the medicines to outpatient provider promptly. To not engage in substance use while on psychiatric medicines.  In the event of worsening symptoms, patient is instructed to call the crisis hotline at 988, 911, or go to the nearest ED for appropriate evaluation and treatment of symptoms. To follow-up with primary care provider for your other medical issues, concerns and, or healthcare needs.

## 2024-05-12 ENCOUNTER — Inpatient Hospital Stay (HOSPITAL_COMMUNITY)
Admission: AD | Admit: 2024-05-12 | Discharge: 2024-05-25 | DRG: 885 | Disposition: A | Discharge status: Home / Self Care | Source: Intra-hospital | Attending: Psychiatry | Admitting: Psychiatry

## 2024-05-12 ENCOUNTER — Encounter (HOSPITAL_COMMUNITY): Payer: Self-pay

## 2024-05-12 DIAGNOSIS — R45851 Suicidal ideations: Secondary | ICD-10-CM | POA: Diagnosis present

## 2024-05-12 DIAGNOSIS — F32A Depression, unspecified: Secondary | ICD-10-CM | POA: Diagnosis present

## 2024-05-12 DIAGNOSIS — R630 Anorexia: Secondary | ICD-10-CM | POA: Diagnosis present

## 2024-05-12 DIAGNOSIS — Z68.41 Body mass index (BMI) pediatric, greater than or equal to 95th percentile for age: Secondary | ICD-10-CM

## 2024-05-12 DIAGNOSIS — F29 Unspecified psychosis not due to a substance or known physiological condition: Secondary | ICD-10-CM | POA: Diagnosis not present

## 2024-05-12 DIAGNOSIS — E669 Obesity, unspecified: Secondary | ICD-10-CM | POA: Diagnosis present

## 2024-05-12 DIAGNOSIS — F419 Anxiety disorder, unspecified: Secondary | ICD-10-CM | POA: Diagnosis present

## 2024-05-12 DIAGNOSIS — Z79899 Other long term (current) drug therapy: Secondary | ICD-10-CM

## 2024-05-12 DIAGNOSIS — F23 Brief psychotic disorder: Secondary | ICD-10-CM | POA: Diagnosis present

## 2024-05-12 DIAGNOSIS — W1830XA Fall on same level, unspecified, initial encounter: Secondary | ICD-10-CM | POA: Diagnosis not present

## 2024-05-12 DIAGNOSIS — F2081 Schizophreniform disorder: Secondary | ICD-10-CM | POA: Diagnosis present

## 2024-05-12 DIAGNOSIS — F909 Attention-deficit hyperactivity disorder, unspecified type: Secondary | ICD-10-CM | POA: Diagnosis present

## 2024-05-12 DIAGNOSIS — Z818 Family history of other mental and behavioral disorders: Secondary | ICD-10-CM | POA: Diagnosis not present

## 2024-05-12 MED ORDER — ACETAMINOPHEN 325 MG PO TABS
650.0000 mg | ORAL_TABLET | Freq: Four times a day (QID) | ORAL | Status: DC | PRN
  Filled 2024-05-12: qty 2

## 2024-05-12 MED ORDER — DIPHENHYDRAMINE HCL 50 MG/ML IJ SOLN: 50.0000 mg | Freq: Three times a day (TID) | INTRAMUSCULAR | Status: DC | PRN

## 2024-05-12 MED ORDER — HALOPERIDOL LACTATE 5 MG/ML IJ SOLN
5.0000 mg | Freq: Three times a day (TID) | INTRAMUSCULAR | Status: DC | PRN
  Administered 2024-05-15 (×2): 5 mg via INTRAMUSCULAR
  Filled 2024-05-12: qty 1

## 2024-05-12 MED ORDER — ALUM & MAG HYDROXIDE-SIMETH 200-200-20 MG/5ML PO SUSP: 30.0000 mL | ORAL | Status: DC | PRN

## 2024-05-12 MED ORDER — HALOPERIDOL 5 MG PO TABS: 5.0000 mg | ORAL_TABLET | Freq: Three times a day (TID) | ORAL | Status: DC | PRN

## 2024-05-12 MED ORDER — MAGNESIUM HYDROXIDE 400 MG/5ML PO SUSP: 30.0000 mL | Freq: Every day | ORAL | Status: DC | PRN

## 2024-05-12 MED ORDER — LORAZEPAM 2 MG/ML IJ SOLN
2.0000 mg | Freq: Three times a day (TID) | INTRAMUSCULAR | Status: DC | PRN
  Administered 2024-05-13: 2 mg via INTRAMUSCULAR

## 2024-05-12 MED ORDER — HALOPERIDOL LACTATE 5 MG/ML IJ SOLN
10.0000 mg | Freq: Three times a day (TID) | INTRAMUSCULAR | Status: DC | PRN
  Administered 2024-05-13: 10 mg via INTRAMUSCULAR
  Filled 2024-05-12: qty 2

## 2024-05-12 MED ORDER — ARIPIPRAZOLE 10 MG PO TABS
10.0000 mg | ORAL_TABLET | Freq: Every day | ORAL | Status: DC
  Filled 2024-05-12: qty 1

## 2024-05-12 MED ORDER — LORAZEPAM 2 MG/ML IJ SOLN
2.0000 mg | Freq: Three times a day (TID) | INTRAMUSCULAR | Status: DC | PRN
  Filled 2024-05-12: qty 1

## 2024-05-12 MED ORDER — DIPHENHYDRAMINE HCL 50 MG/ML IJ SOLN
50.0000 mg | Freq: Three times a day (TID) | INTRAMUSCULAR | Status: DC | PRN
  Administered 2024-05-13 – 2024-05-15 (×3): 50 mg via INTRAMUSCULAR
  Filled 2024-05-12 (×2): qty 1

## 2024-05-12 MED ORDER — TRAZODONE HCL 50 MG PO TABS
50.0000 mg | ORAL_TABLET | Freq: Every evening | ORAL | Status: DC | PRN
  Administered 2024-05-18 – 2024-05-21 (×3): 50 mg via ORAL
  Filled 2024-05-12 (×3): qty 1

## 2024-05-12 MED ORDER — HYDROXYZINE HCL 25 MG PO TABS
25.0000 mg | ORAL_TABLET | Freq: Three times a day (TID) | ORAL | Status: DC | PRN
  Administered 2024-05-18 – 2024-05-19 (×2): 25 mg via ORAL
  Filled 2024-05-12 (×3): qty 1

## 2024-05-12 MED ORDER — ARIPIPRAZOLE 15 MG PO TABS: 15.0000 mg | ORAL_TABLET | Freq: Every day | ORAL | Status: DC

## 2024-05-12 MED ORDER — DIPHENHYDRAMINE HCL 25 MG PO CAPS: 50.0000 mg | ORAL_CAPSULE | Freq: Three times a day (TID) | ORAL | Status: DC | PRN

## 2024-05-12 NOTE — Progress Notes (Signed)
 Patient ID: Patricia Branch, female   DOB: 28-Jun-2006, 18 y.o.   MRN: [id number] D: Pt here IVC from [facility]. Pt denies HI and pain at this time. Pt endorses passive SI without a plan and contracts for safety at [facility]. Pt endorses AVH of hearing and seeing demons. Pt states that she has been hearing voices since she moved into the neighborhood about a year ago. Pt says that she sees demons possessing her neighbors and other people. They are possessing me. They don't want me to talk. Wait a minute, they are trying to take over me now. Pt is tapping on the table and twisting her head saying that she is hearing a demonic voice trying to possess her. Pt states the demons told her to kill her mother. Then they told her to go to the hospital instead.   Pt then begins to answer questions in the third person. Pt asked suicide assessment questions and responded, We wanted her to kill herself because we don't like her and want her to go to hell. We think she was going to take pills. She has some pills. We don't think she was going to do it.  Pt disorganized with rapid, tangential speech. Stops speaking mid sentence because she says she is being possessed and trying to fight it. Poor historian. Alert but only oriented to situation and person. Pt confused because voices are speaking to her. Denies drug, alcohol and tobacco use. States a doctor prescribed a medication for her that she took once. Could be lurasidone.  Pt states that the demons have been causing her to faint and fall down. Denies hitting her head or any other injury.  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 Lives with her parents and siblings. Reports a difficult relationship with family. Says she didn't complete high school. Unemployed.   States this is her first psych admission.   A: Pt was offered support and encouragement. Pt is cooperative during assessment. VS assessed and admission paperwork signed. Belongings searched and contraband  items placed in locker. Non-invasive skin search completed: abrasions to front lower leg. Pt says she scratches herself. Pt offered food and drink and both accepted. Pt introduced to unit milieu by nursing staff. Q 15 minute checks were started for safety.   R: Pt in room. Pt safety maintained on unit.

## 2024-05-12 NOTE — Plan of Care (Addendum)
 Pt noted sitting on bed on initial interactions, animated with inappropriate laughter at intervals, brief eye contact, disorganized and tangential speech. Pt continues to report being endorsed by demons, talks in the 3rd person phrases. Denies SI, HI and pain They're fine. They can't tell you that. Continues to endorse +AVH when engaged but will not elaborate further. Rates her anger and depression 0/10 and anxiety 8/10 with stressor being current mental state I'm worried about these psychotic episodes. Something in my body won't let me talk right now. They can't talk about anything you're asking. They're controlling me. Noted to be paranoid as well. Pt declined Abilify , spat it in cup despite multiple attempts I can't, they say you're trying to poison me. I don't want it Pt refused all meals and supplements when offered They don't want it. They can't eat because they will choke and die.   Problem: Safety: Goal: Periods of time without injury will increase Outcome: Progressing   Problem: Health Behavior/Discharge Planning: Goal: Compliance with treatment plan for underlying cause of condition will improve Outcome: Not Progressing

## 2024-05-12 NOTE — [facility] Suicide Risk Assessment (Signed)
 [facility] Admission Suicide Risk Assessment   Nursing information obtained from:  Patient Demographic factors:  Unemployed, Adolescent or young adult Current Mental Status:  Suicidal ideation indicated by patient Loss Factors:  NA Historical Factors:  NA Risk Reduction Factors:  Living with another person, especially a relative, Positive social support, Religious beliefs about death  Total Time spent with patient: 45 minutes Principal Problem: Acute psychosis (HCC) Diagnosis:  Principal Problem:   Acute psychosis (HCC)  Subjective Data: see H&P  Continued Clinical Symptoms:  Alcohol Use Disorder Identification Test Final Score (AUDIT): 0 The Alcohol Use Disorders Identification Test, Guidelines for Use in Primary Care, Second Edition.  World Science writer [facility]). Score between 0-7:  no or low risk or alcohol related problems. Score between 8-15:  moderate risk of alcohol related problems. Score between 16-19:  high risk of alcohol related problems. Score 20 or above:  warrants further diagnostic evaluation for alcohol dependence and treatment.   CLINICAL FACTORS:   Currently Psychotic Unstable or Poor Therapeutic Relationship   Musculoskeletal: Strength & Muscle Tone: within normal limits Gait & Station: normal Patient leans: N/A  Psychiatric Specialty Exam:  Presentation  General Appearance:  Bizarre; Disheveled  Eye Contact: Fleeting  Speech: Blocked  Speech Volume: Decreased  Handedness: Right   Mood and Affect  Mood: Anxious; Depressed  Affect: Constricted   Thought Process  Thought Processes: Disorganized  Descriptions of Associations:Circumstantial  Orientation:Full (Time, Place and Person)  Thought Content:Delusions; Paranoid Ideation; Rumination; Scattered  History of Schizophrenia/Schizoaffective disorder:No  Duration of Psychotic Symptoms:Less than six months  Hallucinations:Hallucinations: Auditory Description of Auditory  Hallucinations: patient reports hearing demons talking to her  Ideas of Reference:Paranoia; Delusions  Suicidal Thoughts:Suicidal Thoughts: Yes, Passive  Homicidal Thoughts:Homicidal Thoughts: No   Sensorium  Memory: Immediate Fair  Judgment: Fair  Insight: Fair   Art therapist  Concentration: Fair  Attention Span: Fair  Recall: Fair  Fund of Knowledge: Fair  Language: Fair   Psychomotor Activity  Psychomotor Activity: Psychomotor Activity: Psychomotor Retardation   Assets  Assets: Communication Skills; Desire for Improvement; Social Support; Leisure Time   Sleep  Sleep: Sleep: Poor    Physical Exam: Physical Exam ROS Blood pressure 131/70, pulse 93, resp. rate 14, height 5' 5 (1.651 m), weight 109.6 kg, SpO2 100%. Body mass index is 40.2 kg/m.   COGNITIVE FEATURES THAT CONTRIBUTE TO RISK:  Loss of executive function    SUICIDE RISK:   Severe:  Frequent, intense, and enduring suicidal ideation, specific plan, no subjective intent, but some objective markers of intent (i.e., choice of lethal method), the method is accessible, some limited preparatory behavior, evidence of impaired self-control, severe dysphoria/symptomatology, multiple risk factors present, and few if any protective factors, particularly a lack of social support.  PLAN OF CARE: admit to inpatient psychiatry  I certify that inpatient services furnished can reasonably be expected to improve the patient's condition.   Mikeal Winstanley, MD 05/12/2024, 12:20 PM
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
## 2024-05-12 NOTE — Group Note (Signed)
 Date:  05/12/2024 Time:  8:48 PM  Group Topic/Focus:  Wrap-Up Group:   The focus of this group is to help patients review their daily goal of treatment and discuss progress on daily workbooks.    Participation Level:  Did Not Attend   05/12/2024, 8:48 PM

## 2024-05-12 NOTE — H&P (Signed)
 Psychiatric Admission Assessment Adult  Patient Identification: Patricia Branch MRN:  [id number] Date of Evaluation:  05/12/2024 Chief Complaint:  Acute psychosis (HCC) [F23] Principal Diagnosis: Acute psychosis (HCC) Diagnosis:  Principal Problem:   Acute psychosis (HCC)  History of Present Illness:  18 y/o female with no prior psychiatric history reported with new onset of psychosis for the past month who presented to [facility] with her Kaneisha Ellenberger) on 05/10/24.   Per Evaluation at that time with psychiatric NP Williams:  Patient is observed in the interview room sitting in the chair, patient scanning the room and talking to herself.  When asked a question patient stated I have this problem I cannot talk because they will not let me talk, patient went on to say that I have been getting spirits and my body and the demons are talking to me but they will not allow me to talk sometimes.  When asked any questions patient would talk in the third person and at times patient was states do not talk to him do not tell him anything.  Patient obviously has experienced some brief psychosis and auditory hallucination patient is even responding to internal stimuli.  At one point patient stated there are a lot of  things going on in the neighborhood and I cannot talk about it.  They will not let me talk.   Collateral obtained from father : Per patient's father patient has been acting like this for the past 30 days or so at first he stated that they did not understand what was going on and he thought that the patient was acting out based on something they see on to talk or Instagram but it became more apparent that the patient is hallucinating at times and the patient keep talking more about demons and devils.  According to the patient's father patient is currently not seeing a psychiatrist or therapist and is not diagnosed with anything right now.  Per the patient's father he did know that the patient's mother was thinking  about taking the patient to a doctor but he is not sure if they did follow up with that appointment.  Patient currently lives at home with mother and father and other siblings.     Patient seen at bedside and is oriented x3. Patient is responding to internal stimuli and has thought blocking and difficulty concentrating with speech latency.  Patient is looking around the room with noon there. Patient states she has been harassed by demons and people in her neighborhood. States she has been afraid to sleep at night and is anxious.   Reports SI without a plan. Denies HI. Denies AVH. According to patient she does not drink or smoke.  Patient tells me she can hear demons saying derogatory things but I read the bible and it protects me. States she is afraid to fall asleep because they will kill her. Patient states that she is not eating because God is telling her that I am trying to kill her. Patient reports she has had suicidal thoughts the last several days but has not had any plans and denies feeling depressed. Denies feeling hopeless and states God gives me hope and it's protecting me. Patient poor historian and states she has never taken any medications for mood despite evidence to the contrary.   Associated Signs/Symptoms: Depression Symptoms:  psychomotor agitation, recurrent thoughts of death, disturbed sleep, (Hypo) Manic Symptoms:  Delusions, Distractibility, Hallucinations, Impulsivity, Irritable Mood, Anxiety Symptoms:  Excessive Worry, Psychotic Symptoms:  Delusions, Hallucinations:  Auditory Ideas of Reference, Paranoia, PTSD Symptoms: NA Total Time spent with patient: 45 minutes  Past Psychiatric History:  Denies prior hospitalizations Denies prior suicide attempts Seeing Provider through [facility] on review of medication fills and was prescribed Klonopin, D-amphetamine 15 mg ER, Lexapro 20 mg qdaily and Latuda 60 mg qdaily Patient denies prior suicide attempts Denies  history of violence  Is the patient at risk to self? Yes.    Has the patient been a risk to self in the past 6 months? No.  Has the patient been a risk to self within the distant past? No.  Is the patient a risk to others? No.  Has the patient been a risk to others in the past 6 months? No.  Has the patient been a risk to others within the distant past? No.   [place] Scale:  Flowsheet Row Ad[facility] (Current) [facility] from 05/10/2024 in [facility]  C-SSRS RISK CATEGORY High Risk High Risk     Prior Inpatient Therapy: No. Prior Outpatient Therapy: No.   Alcohol Screening: 1. How often do you have a drink containing alcohol?: Never 2. How many drinks containing alcohol do you have on a typical day when you are drinking?: 1 or 2 3. How often do you have six or more drinks on one occasion?: Never AUDIT-C Score: 0 9. Have you or someone else been injured as a result of your drinking?: No 10. Has a relative or friend or a doctor or another health worker been concerned about your drinking or suggested you cut down?: No Alcohol Use Disorder Identification Test Final Score (AUDIT): 0 Substance Abuse History in the last 12 months:  No. Consequences of Substance Abuse: Negative Previous Psychotropic Medications: No  Psychological Evaluations: No  Past Medical History: History reviewed. No pertinent past medical history. History reviewed. No pertinent surgical history. Family History: History reviewed. No pertinent family history. Family Psychiatric  History: denies and denies any suicide attempts Tobacco Screening:  Social History   Tobacco Use  Smoking Status Never  Smokeless Tobacco Not on file    BH Tobacco Counseling     Are you interested in Tobacco Cessation Medications?  N/A, patient does not use tobacco products Counseled patient on smoking cessation:  N/A, patient does not use tobacco  products Reason Tobacco Screening Not Completed: No value filed.       Social History:  Social History   Substance and Sexual Activity  Alcohol Use No     Social History   Substance and Sexual Activity  Drug Use Never    Additional Social History: Marital status: Single Are you sexually active?: No What is your sexual orientation?: Heterosexual. Has your sexual activity been affected by drugs, alcohol, medication, or emotional stress?: No Does patient have children?: No                         Allergies:  No Known Allergies Lab Results:  Results for orders placed or performed during the hospital encounter of 05/10/24 (from the past 48 hours)  CBC with Differential/Platelet     Status: Abnormal   Collection Time: 05/10/24 11:12 PM  Result Value Ref Range   WBC 9.6 4.0 - 10.5 K/uL   RBC 4.89 3.87 - 5.11 MIL/uL   Hemoglobin 10.9 (L) 12.0 - 15.0 g/dL   HCT 64.1 (L) 63.9 - 53.9 %   MCV 73.2 (L) 80.0 -  100.0 fL   MCH 22.3 (L) 26.0 - 34.0 pg   MCHC 30.4 30.0 - 36.0 g/dL   RDW 83.0 (H) 88.4 - 84.4 %   Platelets 449 (H) 150 - 400 K/uL   nRBC 0.0 0.0 - 0.2 %   Neutrophils Relative % 69 %   Neutro Abs 6.7 1.7 - 7.7 K/uL   Lymphocytes Relative 20 %   Lymphs Abs 2.0 0.7 - 4.0 K/uL   Monocytes Relative 9 %   Monocytes Absolute 0.8 0.1 - 1.0 K/uL   Eosinophils Relative 1 %   Eosinophils Absolute 0.1 0.0 - 0.5 K/uL   Basophils Relative 1 %   Basophils Absolute 0.1 0.0 - 0.1 K/uL   Immature Granulocytes 0 %   Abs Immature Granulocytes 0.02 0.00 - 0.07 K/uL    Comment: Performed at [facility], [address] [place], [place] [address]  Comprehensive metabolic panel     Status: None   Collection Time: 05/10/24 11:12 PM  Result Value Ref Range   Sodium 143 135 - 145 mmol/L   Potassium 4.1 3.5 - 5.1 mmol/L   Chloride 105 98 - 111 mmol/L   CO2 25 22 - 32 mmol/L   Glucose, Bld 77 70 - 99 mg/dL    Comment: Glucose reference range applies only to samples taken  after fasting for at least 8 hours.   BUN 12 6 - 20 mg/dL   Creatinine, Ser 9.23 0.44 - 1.00 mg/dL   Calcium 9.4 8.9 - 89.6 mg/dL   Total Protein 7.4 6.5 - 8.1 g/dL   Albumin 3.6 3.5 - 5.0 g/dL   AST 20 15 - 41 U/L   ALT 18 0 - 44 U/L   Alkaline Phosphatase 70 38 - 126 U/L   Total Bilirubin 0.8 0.0 - 1.2 mg/dL   GFR, Estimated >39 >39 mL/min    Comment: (NOTE) Calculated using the CKD-EPI Creatinine Equation (2021)    Anion gap 13 5 - 15    Comment: Performed at [facility], [address] [place], [place] [address]  Ethanol     Status: None   Collection Time: 05/10/24 11:12 PM  Result Value Ref Range   Alcohol, Ethyl (B) <15 <15 mg/dL    Comment: (NOTE) For medical purposes only. Performed at [facility], [address], [place], [place] [address]   TSH     Status: None   Collection Time: 05/10/24 11:12 PM  Result Value Ref Range   TSH 3.795 0.350 - 4.500 uIU/mL    Comment: Performed by a 3rd Generation assay with a functional sensitivity of <=0.01 uIU/mL. Performed at [facility], [address], [place], [place] [address]   POCT Urine Drug Screen - (I-Screen)     Status: Normal   Collection Time: 05/11/24 11:32 AM  Result Value Ref Range   POC Amphetamine UR None Detected NONE DETECTED (Cut Off Level 1000 ng/mL)   POC Secobarbital (BAR) None Detected NONE DETECTED (Cut Off Level 300 ng/mL)   POC Buprenorphine (BUP) None Detected NONE DETECTED (Cut Off Level 10 ng/mL)   POC Oxazepam (BZO) None Detected NONE DETECTED (Cut Off Level 300 ng/mL)   POC Cocaine UR None Detected NONE DETECTED (Cut Off Level 300 ng/mL)   POC Methamphetamine UR None Detected NONE DETECTED (Cut Off Level 1000 ng/mL)   POC Morphine None Detected NONE DETECTED (Cut Off Level 300 ng/mL)   POC Methadone UR None Detected NONE DETECTED (Cut  Off Level 300 ng/mL)   POC Oxycodone UR None Detected NONE DETECTED (Cut Off Level 100 ng/mL)   POC Marijuana UR None Detected NONE DETECTED  (Cut Off Level 50 ng/mL)  POC urine preg, ED     Status: None   Collection Time: 05/11/24 11:39 AM  Result Value Ref Range   Preg Test, Ur Negative Negative    Blood Alcohol level:  Lab Results  Component Value Date   [facility] <15 05/10/2024    Metabolic Disorder Labs:  Lab Results  Component Value Date   HGBA1C 5.6 05/23/2014   No results found for: PROLACTIN Lab Results  Component Value Date   CHOL 134 05/23/2014   TRIG 48 05/23/2014   HDL 49 05/23/2014   VLDL 10 05/23/2014   LDLCALC 75 05/23/2014    Current Medications: Current Facility-Administered Medications  Medication Dose Route Frequency Provider Last Rate Last Admin   acetaminophen  (TYLENOL ) tablet 650 mg  650 mg Oral Q6H PRN Ajibola, Ene A, NP       alum & mag hydroxide-simeth (MAALOX/MYLANTA) 200-200-20 MG/5ML suspension 30 mL  30 mL Oral Q4H PRN Ajibola, Ene A, NP       haloperidol  (HALDOL ) tablet 5 mg  5 mg Oral TID PRN Ajibola, Ene A, NP       And   diphenhydrAMINE  (BENADRYL ) capsule 50 mg  50 mg Oral TID PRN Ajibola, Ene A, NP       haloperidol  lactate (HALDOL ) injection 5 mg  5 mg Intramuscular TID PRN Ajibola, Ene A, NP       And   diphenhydrAMINE  (BENADRYL ) injection 50 mg  50 mg Intramuscular TID PRN Ajibola, Ene A, NP       And   LORazepam  (ATIVAN ) injection 2 mg  2 mg Intramuscular TID PRN Ajibola, Ene A, NP       haloperidol  lactate (HALDOL ) injection 10 mg  10 mg Intramuscular TID PRN Ajibola, Ene A, NP       And   diphenhydrAMINE  (BENADRYL ) injection 50 mg  50 mg Intramuscular TID PRN Ajibola, Ene A, NP       And   LORazepam  (ATIVAN ) injection 2 mg  2 mg Intramuscular TID PRN Ajibola, Ene A, NP       hydrOXYzine  (ATARAX ) tablet 25 mg  25 mg Oral TID PRN Ajibola, Ene A, NP       magnesium  hydroxide (MILK OF MAGNESIA) suspension 30 mL  30 mL Oral Daily PRN Ajibola, Ene A, NP       traZODone  (DESYREL ) tablet 50 mg  50 mg Oral QHS PRN Ajibola, Ene A, NP       PTA Medications: No medications prior  to admission.    AIMS:  ,  ,  ,  ,  ,  ,    Musculoskeletal: Strength & Muscle Tone: within normal limits Gait & Station: normal Patient leans: N/A            Psychiatric Specialty Exam:  Presentation  General Appearance:  Bizarre; Disheveled  Eye Contact: Fleeting  Speech: Blocked  Speech Volume: Decreased  Handedness: Right   Mood and Affect  Mood: Anxious; Depressed  Affect: Constricted   Thought Process  Thought Processes: Disorganized  Duration of Psychotic Symptoms:1 month Past Diagnosis of Schizophrenia or Psychoactive disorder: No  Descriptions of Associations:Circumstantial  Orientation:Full (Time, Place and Person)  Thought Content:Delusions; Paranoid Ideation; Rumination; Scattered  Hallucinations:Hallucinations: Auditory Description of Auditory Hallucinations: patient reports hearing demons talking to her  Ideas  of Reference:Paranoia; Delusions  Suicidal Thoughts:Suicidal Thoughts: Yes, Passive  Homicidal Thoughts:Homicidal Thoughts: No   Sensorium  Memory: Immediate Fair  Judgment: Fair  Insight: Fair   Chartered certified accountant: Fair  Attention Span: Fair  Recall: [employer] of Knowledge: Fair  Language: Fair   Psychomotor Activity  Psychomotor Activity: Psychomotor Activity: Psychomotor Retardation   Assets  Assets: Communication Skills; Desire for Improvement; Social Support; Leisure Time   Sleep  Sleep: Sleep: Poor  Estimated Sleeping Duration (Last 24 Hours): 0.00 hours   Physical Exam:  General: Well developed, well nourished, AA female Pupils: Normal at 3mm Respiratory: Breathing is unlabored.  Cardiovascular: No edema.  Language: No anomia, no aphasia Muscle strength and tone-pt moving all extremities.  Gait not assessed as pt remained in bed.  Neuro: Facial muscles are symmetric. Pt without tremor, no evidence of hyperarousal.  Review of Systems   Constitutional: Negative.   HENT: Negative.    Eyes: Negative.   Respiratory: Negative.    Cardiovascular: Negative.   Gastrointestinal: Negative.   Genitourinary: Negative.   Musculoskeletal: Negative.   Skin: Negative.   Neurological: Negative.   Endo/Heme/Allergies: Negative.   Psychiatric/Behavioral:  Positive for hallucinations and suicidal ideas. The patient is nervous/anxious and has insomnia.    Blood pressure 131/70, pulse 93, resp. rate 14, height 5' 5 (1.651 m), weight 109.6 kg, SpO2 100%. Body mass index is 40.2 kg/m.  Assessment: 18 y/o female with no prior psychiatric history reported with new onset of psychosis for the past month who presented to [facility] with her Curstin Schmale) on 05/10/24.   On chart review of PDMP patient has been prescribed D-amphetamine ER 15 mg qdaily last filled 04/2024 but has been taking all of this and prescribed every month. She was also recently prescribed Latuda which was increased to 60 mg last month, which indicated concern for potential psychosis. Patient's family notes she has been psychotic for the past month with no known prior history. Patient remains floridly psychotic, RTIS and disorganized. Reporting SI without a plan and AH. Differential diagnosis does include stimulant induced psychosis, however patient's UDS was negative for stimulants and she remains psychotic for the past month. Patient received Zyprexa  yesterday in ED however remains psychotic. 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 Will order RPR testing as well as B12 and folate.   Patient poor historian and states she has never taken any medications for mood despite evidence to the contrary. Denies feeling hopeless and states God gives me hope and it's protecting me. Patient poor historian and states she has never taken any medications for mood despite evidence to the contrary.   Spoke with patient's  father with her permission and we discussed r/b/a of starting Abilify  versus Risperdal for first break psychosis. He would like to try Abilify  first. Reports new onset psychosis for the past month. He was not aware that she was seeing a psychiatrist and taking a stimulant as reports he is usually at work.   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) Start Abilify  10 mg qdaily for first onset psychosis, if patient does not have favorable response will switch to Risperdal -trazodone  50 mg at bedtime-PRN for insomnia -hydroxyzine  25 mg TID-PRN for anxiety   Physician Treatment Plan for Primary Diagnosis: Acute psychosis (HCC) Long Term Goal(s): Improvement in symptoms so as ready for discharge  Short Term Goals: Ability to identify changes in lifestyle to reduce recurrence of condition will improve, Ability to verbalize feelings will improve, Ability to disclose and discuss suicidal ideas, Ability to demonstrate self-control will improve, Ability to identify and develop effective coping behaviors will improve, Ability to maintain clinical measurements within normal limits will improve, Compliance with prescribed medications will improve, and Ability to identify triggers associated with substance abuse/mental health issues will improve I certify that inpatient services furnished can reasonably be expected to improve the patient's condition.    Lacharles Altschuler, MD 8/9/202510:45 AM

## 2024-05-12 NOTE — Progress Notes (Signed)
(  Sleep Hours) - 1.25 (Any PRNs that were needed, meds refused, or side effects to meds)- none (Any disturbances and when (visitation, over night)- none (Concerns raised by the patient)- none (SI/HI/AVH)- passive SI, AVH of seeing and hearing demons possessing her

## 2024-05-12 NOTE — Plan of Care (Signed)
 Pt was admitted after midnight 05/12/24  Problem: Coping: Goal: Ability to verbalize frustrations and anger appropriately will improve Outcome: Progressing Goal: Ability to demonstrate self-control will improve Outcome: Progressing

## 2024-05-12 NOTE — Group Note (Signed)
 LCSW Group Therapy Note  Group Date: 05/12/2024 Start Time: 1300 End Time: 1400   Type of Therapy and Topic:  Group Therapy Positive Self-Talk and Healthy Coping Skills. Participation Level:  Active   Description of Group The focus of this group was to determine what unhealthy coping techniques typically are used by group members and what healthy coping techniques would be helpful in coping with various problems. Patients were guided in becoming aware of the differences between healthy and unhealthy coping techniques. Patients were asked to identify 2-3 healthy coping skills they would like to learn to use more effectively.  Therapeutic Goals Patients learned that coping is what human beings do all day long to deal with various situations in their lives Patients defined and discussed healthy vs unhealthy coping techniques Patients identified their preferred coping techniques and identified whether these were healthy or unhealthy Patients determined 2-3 healthy coping skills they would like to become more familiar with and use more often. Patients provided support and ideas to each other   Summary of Patient Progress:  During group, Irena expressed positive coping skills talking and praising God while not worrying about other things. Patient proved open to input from peers and feedback from CSW. Patient demonstrated good insight into the subject matter, was respectful of peers, and participated throughout the entire session.   Therapeutic Modalities Cognitive Behavioral Therapy Motivational Interviewing  Hunter JONELLE Lever, LCSWA 05/12/2024  3:15 PM

## 2024-05-12 NOTE — Tx Team (Signed)
 Initial Treatment Plan 05/12/2024 2:38 AM Patricia Branch FMW:[id number]    PATIENT STRESSORS: Other: hallucinations, delusions     PATIENT STRENGTHS: Average or above average intelligence  Supportive family/friends    PATIENT IDENTIFIED PROBLEMS: Suicidal ideatiosn  Psychosis (AVH of demons)  (Pt is too psychotic to provide a goal)                 DISCHARGE CRITERIA:  Improved stabilization in mood, thinking, and/or behavior Verbal commitment to aftercare and medication compliance  PRELIMINARY DISCHARGE PLAN: Outpatient therapy Return to previous living arrangement  PATIENT/FAMILY INVOLVEMENT: This treatment plan has been presented to and reviewed with the patient, Patricia Branch, and/or family member.  The patient and family have been given the opportunity to ask questions and make suggestions.  Loetta DELENA Pinal, RN 05/12/2024, 2:38 AM

## 2024-05-12 NOTE — Progress Notes (Signed)
   05/12/24 0030  Psych Admission Type (Psych Patients Only)  Admission Status Involuntary  Psychosocial Assessment  Patient Complaints Self-harm thoughts;Anxiety;Depression  Eye Contact Brief  Facial Expression Anxious  Affect Preoccupied  Speech Tangential;Pressured;Rapid  Interaction Assertive  Motor Activity Slow  Appearance/Hygiene Body odor;In scrubs  Behavior Characteristics Cooperative;Anxious;Fidgety  Mood Preoccupied  Thought Process  Coherency Disorganized  Content Delusions  Delusions Religious  Perception Hallucinations;Derealization  Hallucination Auditory;Command;Visual (hears demonic voices telling her to harm her mother and can see demons possessing people)  Judgment Impaired  Confusion Mild  Danger to Self  Current suicidal ideation? Passive  Self-Injurious Behavior No self-injurious ideation or behavior indicators observed or expressed   Agreement Not to Harm Self Yes  Description of Agreement verbal  Danger to Others  Danger to Others None reported or observed

## 2024-05-12 NOTE — Plan of Care (Signed)

## 2024-05-13 DIAGNOSIS — F23 Brief psychotic disorder: Secondary | ICD-10-CM | POA: Diagnosis not present

## 2024-05-13 LAB — LIPID PANEL
Cholesterol: 178 mg/dL (ref 0–200)
HDL: 44 mg/dL (ref >40)
LDL Cholesterol: 127 mg/dL — ABNORMAL HIGH (ref 0–99)
Total CHOL/HDL Ratio: 4 ratio
Triglycerides: 37 mg/dL (ref <150)
VLDL: 7 mg/dL (ref 0–40)

## 2024-05-13 LAB — CBC WITH DIFFERENTIAL/PLATELET
Abs Immature Granulocytes: 0.03 K/uL (ref 0.00–0.07)
Basophils Absolute: 0.1 K/uL (ref 0.0–0.1)
Basophils Relative: 1 %
Eosinophils Absolute: 0.1 K/uL (ref 0.0–0.5)
Eosinophils Relative: 1 %
HCT: 36.7 % (ref 36.0–46.0)
Hemoglobin: 10.8 g/dL — ABNORMAL LOW (ref 12.0–15.0)
Immature Granulocytes: 0 %
Lymphocytes Relative: 20 %
Lymphs Abs: 1.9 K/uL (ref 0.7–4.0)
MCH: 21.7 pg — ABNORMAL LOW (ref 26.0–34.0)
MCHC: 29.4 g/dL — ABNORMAL LOW (ref 30.0–36.0)
MCV: 73.8 fL — ABNORMAL LOW (ref 80.0–100.0)
Monocytes Absolute: 0.8 K/uL (ref 0.1–1.0)
Monocytes Relative: 9 %
Neutro Abs: 6.7 K/uL (ref 1.7–7.7)
Neutrophils Relative %: 69 %
Platelets: 430 K/uL — ABNORMAL HIGH (ref 150–400)
RBC: 4.97 MIL/uL (ref 3.87–5.11)
RDW: 17 % — ABNORMAL HIGH (ref 11.5–15.5)
WBC: 9.6 K/uL (ref 4.0–10.5)
nRBC: 0 % (ref 0.0–0.2)

## 2024-05-13 LAB — FOLATE: Folate: 15.6 ng/mL (ref >5.9)

## 2024-05-13 LAB — VITAMIN B12: Vitamin B-12: 266 pg/mL (ref 180–914)

## 2024-05-13 LAB — HIV ANTIBODY (ROUTINE TESTING W REFLEX): HIV Screen 4th Generation wRfx: NONREACTIVE

## 2024-05-13 MED ORDER — OLANZAPINE 10 MG IM SOLR
5.0000 mg | Freq: Two times a day (BID) | INTRAMUSCULAR | Status: DC
  Administered 2024-05-14 – 2024-05-17 (×3): 5 mg via INTRAMUSCULAR
  Filled 2024-05-13 (×3): qty 10

## 2024-05-13 MED ORDER — OLANZAPINE 10 MG IM SOLR: 5.0000 mg | Freq: Two times a day (BID) | INTRAMUSCULAR | Status: DC

## 2024-05-13 MED ORDER — OLANZAPINE 5 MG PO TBDP
5.0000 mg | ORAL_TABLET | Freq: Once | ORAL | Status: AC
  Administered 2024-05-13: 5 mg via ORAL

## 2024-05-13 MED ORDER — OLANZAPINE 5 MG PO TBDP
5.0000 mg | ORAL_TABLET | Freq: Two times a day (BID) | ORAL | Status: DC
  Administered 2024-05-13 – 2024-05-16 (×9): 5 mg via ORAL
  Filled 2024-05-13 (×6): qty 1

## 2024-05-13 MED ORDER — OLANZAPINE 10 MG IM SOLR: 5.0000 mg | Freq: Once | INTRAMUSCULAR | Status: AC

## 2024-05-13 MED ORDER — OLANZAPINE 5 MG PO TBDP
5.0000 mg | ORAL_TABLET | Freq: Two times a day (BID) | ORAL | Status: DC
  Filled 2024-05-13: qty 1

## 2024-05-13 NOTE — Progress Notes (Signed)
   05/13/24 2300  Psych Admission Type (Psych Patients Only)  Admission Status Involuntary  Psychosocial Assessment  Patient Complaints Anxiety;Suspiciousness  Eye Contact Avertive  Facial Expression Worried;Anxious  Affect Frightened;Anxious  Speech Argumentative  Interaction Isolative  Motor Activity Slow  Appearance/Hygiene In scrubs (pt took a shower)  Behavior Characteristics Cooperative  Mood Fearful;Suspicious  Aggressive Behavior  Effect No apparent injury  Thought Process  Coherency Disorganized  Content Paranoia  Delusions Paranoid  Perception Derealization  Hallucination Command;Auditory  Judgment Impaired  Confusion Mild  Danger to Self  Current suicidal ideation? Denies   (Sleep Hours) -13 (Any PRNs that were needed, meds refused, or side effects to meds)- none (Any disturbances and when (visitation, over night)-None (Concerns raised by the patient)- none (SI/HI/AVH)-AH

## 2024-05-13 NOTE — Progress Notes (Signed)
   05/12/24 2200  Psych Admission Type (Psych Patients Only)  Admission Status Involuntary  Psychosocial Assessment  Patient Complaints Anxiety;Suspiciousness  Eye Contact Brief  Facial Expression Anxious  Affect Fearful  Speech Soft  Interaction Isolative  Motor Activity Slow  Appearance/Hygiene In scrubs  Behavior Characteristics Cooperative  Mood Pleasant;Suspicious  Aggressive Behavior  Effect No apparent injury  Thought Process  Coherency Disorganized  Content Paranoia  Delusions Paranoid  Perception Hallucinations;Derealization  Hallucination Command  Judgment Poor  Confusion Mild  Danger to Self  Current suicidal ideation? Denies   (Sleep Hours) -7.5 (Any PRNs that were needed, meds refused, or side effects to meds)- no meds administered, but pt is still refusing to eat and drink. (Any disturbances and when (visitation, over night)-none (Concerns raised by the patient)- none (SI/HI/AVH)-Denied

## 2024-05-13 NOTE — Group Note (Signed)
 Date:  05/13/2024 Time:  8:53 PM  Group Topic/Focus:  Wrap-Up Group:   The focus of this group is to help patients review their daily goal of treatment and discuss progress on daily workbooks.    Participation Level:  Did Not Attend     Corrie Brannen Dacosta 05/13/2024, 8:53 PM

## 2024-05-13 NOTE — Progress Notes (Addendum)
 [facility] MD Progress Note  05/13/2024 12:44 PM Patricia Branch  MRN:  [id number] Subjective:    Patient floridly psychotic, spit out Abilify  and is refusing oral medications. Remains agitated, psychotic for 4 days and (2 at [facility] and second day of admission here refusing any antipsychotic). Patient tells me God is telling me you're trying to kill me... you are poisoning me. Patient hearing AH of multiple demons. Admits to thoughts of suicide for several days but denies today. Denies command AH. Patient refuses any antipsychotic medication and remains highly agitated and disorganized. Patient reports fair sleep but poor appetite. Has significant thought blocking and speech latency. I suggested she try Abilify , Risperdal, Zyprexa  or Haldol  but she states she will not take anything and states you are trying to kill me... God is telling me you are trying to poison me. States she will not eat.   I requested second opinion for non emergent forced meds from Dr. Towana and also discussed with patient's father who agrees to forced Zyprexa  as there is acute concern for safety both to patient and other and she has not improved for 4 days due to not taking medication.  Principal Problem: Acute psychosis (HCC) Diagnosis: Principal Problem:   Acute psychosis (HCC)  Total Time spent with patient: 30 minutes  Past Psychiatric History: see H&P  Past Medical History: History reviewed. No pertinent past medical history. History reviewed. No pertinent surgical history. Family History: History reviewed. No pertinent family history. Family Psychiatric  History: see H&P Social History:  Social History   Substance and Sexual Activity  Alcohol Use No     Social History   Substance and Sexual Activity  Drug Use Never    Social History   Socioeconomic History   Marital status: Single    Spouse name: Not on file   Number of children: Not on file   Years of education: Not on file   Highest education level: Not on  file  Occupational History   Not on file  Tobacco Use   Smoking status: Never   Smokeless tobacco: Not on file  Vaping Use   Vaping status: Never Used  Substance and Sexual Activity   Alcohol use: No   Drug use: Never   Sexual activity: Not on file  Other Topics Concern   Not on file  Social History Narrative   Not on file   Social Drivers of Health   Financial Resource Strain: Not on file  Food Insecurity: No Food Insecurity (05/12/2024)   Hunger Vital Sign    Worried About Running Out of Food in the Last Year: Never true    Ran Out of Food in the Last Year: Never true  Transportation Needs: No Transportation Needs (05/12/2024)   PRAPARE - Administrator, Civil Service (Medical): No    Lack of Transportation (Non-Medical): No  Physical Activity: Not on file  Stress: Not on file  Social Connections: Not on file   Additional Social History:                         Sleep: Fair Estimated Sleeping Duration (Last 24 Hours): 8.75-9.75 hours  Appetite:  Poor  Current Medications: Current Facility-Administered Medications  Medication Dose Route Frequency Provider Last Rate Last Admin   acetaminophen  (TYLENOL ) tablet 650 mg  650 mg Oral Q6H PRN Ajibola, Ene A, NP       alum & mag hydroxide-simeth (MAALOX/MYLANTA) 200-200-20 MG/5ML suspension 30 mL  30 mL Oral Q4H PRN Ajibola, Ene A, NP       ARIPiprazole  (ABILIFY ) tablet 15 mg  15 mg Oral QHS Berthold Glace, MD       haloperidol  (HALDOL ) tablet 5 mg  5 mg Oral TID PRN Ajibola, Ene A, NP       And   diphenhydrAMINE  (BENADRYL ) capsule 50 mg  50 mg Oral TID PRN Ajibola, Ene A, NP       haloperidol  lactate (HALDOL ) injection 5 mg  5 mg Intramuscular TID PRN Ajibola, Ene A, NP       And   diphenhydrAMINE  (BENADRYL ) injection 50 mg  50 mg Intramuscular TID PRN Ajibola, Ene A, NP       And   LORazepam  (ATIVAN ) injection 2 mg  2 mg Intramuscular TID PRN Ajibola, Ene A, NP       haloperidol  lactate (HALDOL )  injection 10 mg  10 mg Intramuscular TID PRN Ajibola, Ene A, NP   10 mg at 05/13/24 0758   And   diphenhydrAMINE  (BENADRYL ) injection 50 mg  50 mg Intramuscular TID PRN Ajibola, Ene A, NP   50 mg at 05/13/24 0759   And   LORazepam  (ATIVAN ) injection 2 mg  2 mg Intramuscular TID PRN Ajibola, Ene A, NP   2 mg at 05/13/24 9241   hydrOXYzine  (ATARAX ) tablet 25 mg  25 mg Oral TID PRN Ajibola, Ene A, NP       magnesium  hydroxide (MILK OF MAGNESIA) suspension 30 mL  30 mL Oral Daily PRN Ajibola, Ene A, NP       traZODone  (DESYREL ) tablet 50 mg  50 mg Oral QHS PRN Ajibola, Ene A, NP        Lab Results: No results found for this or any previous visit (from the past 48 hours).  Blood Alcohol level:  Lab Results  Component Value Date   [facility] <15 05/10/2024    Metabolic Disorder Labs: Lab Results  Component Value Date   HGBA1C 5.6 05/23/2014   No results found for: PROLACTIN Lab Results  Component Value Date   CHOL 134 05/23/2014   TRIG 48 05/23/2014   HDL 49 05/23/2014   VLDL 10 05/23/2014   LDLCALC 75 05/23/2014      Musculoskeletal: Strength & Muscle Tone: within normal limits Gait & Station: normal Patient leans: N/A  Psychiatric Specialty Exam:  Presentation  General Appearance:  Bizarre; Disheveled  Eye Contact: Fleeting  Speech: Blocked  Speech Volume: Decreased  Handedness: Right   Mood and Affect  Mood: Anxious; Depressed  Affect: Constricted   Thought Process  Thought Processes: Disorganized  Descriptions of Associations:Circumstantial  Orientation:Full (Time, Place and Person)  Thought Content:Delusions; Paranoid Ideation; Rumination; Scattered  History of Schizophrenia/Schizoaffective disorder:No  Duration of Psychotic Symptoms:Less than six months  Hallucinations:Hallucinations: Auditory Description of Auditory Hallucinations: patient reports hearing demons talking to her  Ideas of Reference:Paranoia; Delusions  Suicidal  Thoughts:denies SI today  Homicidal Thoughts:Homicidal Thoughts: No   Sensorium  Memory: Immediate Fair  Judgment: Fair  Insight: Fair   Chartered certified accountant: Fair  Attention Span: Fair  Recall: [employer] of Knowledge: Fair  Language: Fair   Psychomotor Activity  Psychomotor Activity: Psychomotor agitation   Assets  Assets: Manufacturing systems engineer; Desire for Improvement; Social Support; Leisure Time   Sleep  Sleep: Sleep: Poor    Physical Exam: Physical Exam ROS Blood pressure (!) 144/86, pulse (!) 131, temperature (!) 97.5 F (36.4 C), resp. rate 18, height 5'  5 (1.651 m), weight 109.6 kg, SpO2 99%. Body mass index is 40.2 kg/m.   Treatment Plan Summary: 18 y/o female with no prior psychiatric history reported with new onset of psychosis for the past month who presented to [facility] with her Linnet Bottari) on 05/10/24.     8/9: On chart review of PDMP patient has been prescribed D-amphetamine ER 15 mg qdaily last filled 04/2024 but has been taking all of this and prescribed every month. She was also recently prescribed Latuda which was increased to 60 mg last month, which indicated concern for potential psychosis. Patient's family notes she has been psychotic for the past month with no known prior history. Patient remains floridly psychotic, RTIS and disorganized. Reporting SI without a plan and AH. Differential diagnosis does include stimulant induced psychosis, however patient's UDS was negative for stimulants and she remains psychotic for the past month. Patient received Zyprexa  yesterday in ED however remains psychotic. 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 Will order RPR testing as well as B12 and folate.   Patient poor historian and states she has never taken any medications for mood despite evidence to the contrary. Denies feeling hopeless and states God  gives me hope and it's protecting me. Patient poor historian and states she has never taken any medications for mood despite evidence to the contrary.  Spoke with patient's father with her permission and we discussed r/b/a of starting Abilify  versus Risperdal for first break psychosis. He would like to try Abilify  first. Reports new onset psychosis for the past month. He was not aware that she was seeing a psychiatrist and taking a stimulant as reports he is usually at work.    8/10: patient spitting out medication and believes we are trying to poison her, she is not eating and with recent SI and acute agitation and psychosis not improving for 4 days. Patient is hearing voice of God stating I am trying to kill her and staff are poisoning food. STARR called this morning as patient acutely agitated trying to elope. She is not eating. Discussed with DR. Towana and patient's father and both agree to forced medications as patient will not take any antipsychotic. Denies SI or HI today but has been voicing SI the days leading up to admission and yesterday without a plan.  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 There is evidence of significant and prolonged deterioration over the past 1 month (patient was given 4 days on oral medications and is not taking any oral medication believing we are trying to poison her). She has been offered Abilify , Zyprexa , Risperdal  and refused these medications due to  paranoid delusions and AH telling her we are trying to poison her and kill her.  She meets criteria for involuntary forced medication as outlined in 10A NCAC 26D .1104. Dr. Towana   has also reviewed the case and is providing the second opinion for non-emergent forced medications (see their note from today).    -reassess CBC and CMP  and recheck creatinine as patient has not been eating or drinking per nursing, vital signs stable   Daily contact with patient to assess and evaluate symptoms and progress in treatment, Medication management, and Plan as below   Observation Level/Precautions:  15 minute checks  Laboratory:  CBC Chemistry Profile Folic Acid HbAIC HCG UDS Vitamin B-12  Psychotherapy:    Medications:  Abilify  10 mg daily  Consultations:    Discharge Concerns:  suicidality and florid psychosis  Estimated LOS: 5-7 days  Other:      #brief psychotic disorder (rule out amphetamine induced psychosis) Stop Abilify  10 mg qdaily for first onset psychosis, as patient spitting out medication and believes we are trying to poison her, she is not eating and with recent SI and acute agitation and psychosis not improving for 4 days - start Zyprexa  zydis 5 mg PO BID with IM backup of 5 mg IM BID-PRN only if patient is refusing PO medication -swallow checks as patient spitting out medication and refusign - monitor ins and outs, encourage PO fluid intake -reassess CBC and CMP and recheck creatinine as patient has not been eating or drinking per nursing, vital signs stable    -trazodone  50 mg at bedtime-PRN for insomnia -hydroxyzine  25 mg TID-PRN for anxiety     Physician Treatment Plan for Primary Diagnosis: Acute psychosis (HCC) Long Term Goal(s): Improvement in symptoms so as ready for discharge   Short Term Goals: Ability to identify changes in lifestyle to reduce recurrence of condition will improve, Ability to verbalize feelings will improve, Ability to disclose and discuss suicidal ideas, Ability to demonstrate self-control will improve, Ability to identify and develop effective coping behaviors will improve, Ability to maintain clinical measurements within normal limits will improve, Compliance with prescribed medications will improve, and Ability to identify triggers associated with substance abuse/mental  health issues will improve I certify that inpatient services furnished can reasonably be expected to improve the patient's condition.      Rito Lecomte, MD 05/13/2024, 12:44 PM

## 2024-05-13 NOTE — Plan of Care (Signed)

## 2024-05-13 NOTE — Plan of Care (Addendum)
 Pt awake in bed, easy to arouse on interactions. Cooperative with care at this time with frequent prompts. Apologetic for her outburst earlier at the beginning of this shift. Per pt The voices were telling me that it was time for the rapture; if I didn't leave out of here I will not be saved. They were telling me that Clayborne Sharper was out there waiting for me. I'm sorry to put all of you in trouble. Continues to refused meals and fluids related to paranoia and delusions about chocking I already tried it and I chocked. I don't want anything  Took a sip of water and a vanilla ice cream with increased prompts after taking her Zydis this afternoon. Scheduled labs done this evening, tolerated well. Pt's concerns validated, emotional support, encouragement and reassurance offered to pt.  Safety maintained at Q 15 minutes intervals.   Problem: Safety: Goal: Periods of time without injury will increase Outcome: Progressing   Problem: Coping: Goal: Ability to demonstrate self-control will improve Outcome: Not Progressing   Problem: Safety: Goal: Violent Restraint(s) Outcome: Completed/Met

## 2024-05-13 NOTE — Progress Notes (Signed)
 [facility] Second Physician Opinion Progress Note for Medication Administration to Non-consenting Patients (For Involuntarily Committed Patients)  Patient: Patricia Branch Date of Birth: July 11, 2006 MRN: [id number]  Subjective/objective The patient is a 18 y.o. female admitted for new onset psychosis. This dino witnessed STAR event with the patient this morning where she appeared agitated, attempted to elope from the floor, was expressing delusional content and was disorganized in her thoughts.   Reason for the Medication: The patient, without the benefit of the specific treatment measure, is incapable of participating in any available treatment plan that will give the patient a realistic opportunity of improving the patient's condition. There is, without the benefit of the specific treatment measure, a significant possibility that the patient will harm self or others before improvement of the patient's condition is realized.  Consideration of Side Effects: Consideration of the side effects related to the medication plan has been given.  Rationale for Medication Administration:  The patient is currently presenting as acutely psychotic, unable to engage in conversation regarding diagnosis and treatment, and placing herself and others at risk due to agitated/aggressive behavior.     Leita LOISE Arts, MD 05/13/24  12:34 PM   This documentation is good for (7) seven days from the date of the MD signature. New documentation must be completed every seven (7) days with detailed justification in the medical record if the patient requires continued non-emergent administration of psychotropic medications.

## 2024-05-13 NOTE — CIRT (Addendum)
 STARR CODE called for this patient at 0745 for this patient. Upon approach to the unit patient had eloped off BICU and was screaming and threatening to kill staff, and attempting to exit further onto the BCU. She has remained paranoid, confused, delusional and repeated with those themes during event stating '' I am a prophet there are demons everywhere there are demons inside her and I know what I'm talking about , let me over there so I can kill her . I want out of here. I'm dying. '' Patient unable to be verbally redirected so was immediately placed in upper arm shoulder hold while staff attempting to redirect patient back onto the unit. Manual hold from [phone] as patient was placed back on the unit, which once pt placed back on the unit was released while staff verbally redirected patient and emergent medications prepared. Patient continued to state that she was dying and that ''demons were inside staff '' and herself while redirection attempts being made while emergent medications prepared. Pt then dropped herself to the floor stating '' I'm dyingggg!'' She remains with unpredictable delusional behaviors. Emergent medications were then given while patient was lying on the floor (of her own accord) but held once she was informed she was getting injections as she started to resist so a second manual hold from [phone] was completed to assist with safe medication administration. Please refer to [facility] for injections given.  Pt was immediately released. Pt continued to lay on the floor despite attempts to redirect her back up . She remained on 1.1 during this time and respirations even and unlabored, no injuries noted. Pt did rip paper scrub top during event due to her struggling against staff. After several minutes she was able to be redirected and agreed to get up and ambulated back to her room. She was offered new scrub top and offered breakfast. Family was notified of above event (father) at 21. Pt remains  safe on the unit at this time. MD present for above event and aware, orders placed. Pt has also remained on unit restriction due to her confusion and psychosis and need for constant redirection. Pt is safe, remains in the care of primary RN. Writer acted as Database administrator during event to assist with restrictive episode.

## 2024-05-14 ENCOUNTER — Encounter (HOSPITAL_COMMUNITY): Payer: Self-pay

## 2024-05-14 ENCOUNTER — Inpatient Hospital Stay (HOSPITAL_COMMUNITY)
Admit: 2024-05-14 | Discharge: 2024-05-14 | Disposition: A | Discharge status: Home / Self Care | Payer: Self-pay | Attending: Psychiatry

## 2024-05-14 DIAGNOSIS — F23 Brief psychotic disorder: Secondary | ICD-10-CM | POA: Diagnosis not present

## 2024-05-14 DIAGNOSIS — F2081 Schizophreniform disorder: Principal | ICD-10-CM | POA: Insufficient documentation

## 2024-05-14 LAB — CBC WITH DIFFERENTIAL/PLATELET
Abs Immature Granulocytes: 0.02 K/uL (ref 0.00–0.07)
Basophils Absolute: 0.1 K/uL (ref 0.0–0.1)
Basophils Relative: 1 %
Eosinophils Absolute: 0.2 K/uL (ref 0.0–0.5)
Eosinophils Relative: 2 %
HCT: 36.7 % (ref 36.0–46.0)
Hemoglobin: 10.8 g/dL — ABNORMAL LOW (ref 12.0–15.0)
Immature Granulocytes: 0 %
Lymphocytes Relative: 25 %
Lymphs Abs: 2.3 K/uL (ref 0.7–4.0)
MCH: 22 pg — ABNORMAL LOW (ref 26.0–34.0)
MCHC: 29.4 g/dL — ABNORMAL LOW (ref 30.0–36.0)
MCV: 74.9 fL — ABNORMAL LOW (ref 80.0–100.0)
Monocytes Absolute: 0.8 K/uL (ref 0.1–1.0)
Monocytes Relative: 8 %
Neutro Abs: 5.7 K/uL (ref 1.7–7.7)
Neutrophils Relative %: 64 %
Platelets: 412 K/uL — ABNORMAL HIGH (ref 150–400)
RBC: 4.9 MIL/uL (ref 3.87–5.11)
RDW: 17 % — ABNORMAL HIGH (ref 11.5–15.5)
WBC: 9 K/uL (ref 4.0–10.5)
nRBC: 0 % (ref 0.0–0.2)

## 2024-05-14 LAB — COMPREHENSIVE METABOLIC PANEL WITH GFR
ALT: 13 U/L (ref 0–44)
AST: 17 U/L (ref 15–41)
Albumin: 3.5 g/dL (ref 3.5–5.0)
Alkaline Phosphatase: 60 U/L (ref 38–126)
Anion gap: 12 (ref 5–15)
BUN: 24 mg/dL — ABNORMAL HIGH (ref 6–20)
CO2: 24 mmol/L (ref 22–32)
Calcium: 9.3 mg/dL (ref 8.9–10.3)
Chloride: 106 mmol/L (ref 98–111)
Creatinine, Ser: 0.82 mg/dL (ref 0.44–1.00)
GFR, Estimated: >60 mL/min (ref >60)
Glucose, Bld: 65 mg/dL — ABNORMAL LOW (ref 70–99)
Potassium: 3.8 mmol/L (ref 3.5–5.1)
Sodium: 142 mmol/L (ref 135–145)
Total Bilirubin: 0.8 mg/dL (ref 0.0–1.2)
Total Protein: 7.6 g/dL (ref 6.5–8.1)

## 2024-05-14 LAB — HEMOGLOBIN A1C
Hgb A1c MFr Bld: 5.7 % — ABNORMAL HIGH (ref 4.8–5.6)
Mean Plasma Glucose: 117 mg/dL

## 2024-05-14 LAB — RPR: RPR Ser Ql: NONREACTIVE

## 2024-05-14 MED ORDER — ARIPIPRAZOLE 10 MG PO TABS
10.0000 mg | ORAL_TABLET | Freq: Every day | ORAL | Status: DC
  Administered 2024-05-14 – 2024-05-15 (×4): 10 mg via ORAL
  Filled 2024-05-14 (×2): qty 1

## 2024-05-14 NOTE — Progress Notes (Signed)
   05/14/24 2025  Psych Admission Type (Psych Patients Only)  Admission Status Involuntary  Psychosocial Assessment  Patient Complaints Suspiciousness  Eye Contact Brief  Facial Expression Worried;Anxious  Affect Frightened;Anxious  Speech Argumentative  Interaction Isolative  Motor Activity Slow  Appearance/Hygiene In scrubs (pt took a shower)  Behavior Characteristics Elopement risk  Mood Suspicious  Aggressive Behavior  Effect No apparent injury  Thought Process  Coherency Disorganized  Content Paranoia  Delusions Paranoid  Perception Derealization  Hallucination Command;Auditory  Judgment Impaired  Confusion Mild  Danger to Self  Current suicidal ideation? Denies   (Sleep Hours) -8 (Any PRNs that were needed, meds refused, or side effects to meds)- none (Any disturbances and when (visitation, over night)-none (Concerns raised by the patient)- none (SI/HI/AVH)-AVH

## 2024-05-14 NOTE — Progress Notes (Signed)
   05/14/24 1100  Psych Admission Type (Psych Patients Only)  Admission Status Involuntary  Psychosocial Assessment  Patient Complaints Suspiciousness  Eye Contact Brief  Facial Expression Anxious;Worried  Affect Preoccupied  Dance movement psychotherapist Activity Slow  Appearance/Hygiene In scrubs  Behavior Characteristics Elopement risk  Mood Suspicious  Thought Process  Coherency Disorganized  Content Paranoia  Delusions Paranoid  Perception Derealization  Hallucination Auditory  Judgment Impaired  Confusion Mild  Danger to Self  Current suicidal ideation? Denies  Danger to Others  Danger to Others None reported or observed   Dar Note: Patient presents anxious, suspicious and guarded. Appears disorganized and responding to internal stimuli.  Medications given as prescribed.  Routine safety checks maintained.  Food and fluid intake offered and encouraged due to poor appetite.  Patient is safe on and off the unit.

## 2024-05-14 NOTE — Group Note (Signed)
 LCSW Group Therapy Note   Group Date: 05/14/2024 Start Time: 1300 End Time: 1400   Participation:  did not attend  Type of Therapy:  Group Therapy  Topic:  Lifestyle:  From "One Day" to "Today is Day One"  Objective:  To promote mental and physical well-being through lifestyle changes in routine, nutrition, sleep, and movement.  Goals: Increase awareness of how lifestyle habits impact mental health. Encourage one small, achievable wellness goal. Support group sharing and accountability.  Summary:  Group members explored how daily habits influence mental health and discussed the importance of starting with small, manageable changes. Participants identified personal goals and shared reflections on improving structure, sleep, diet, and physical activity.  Therapeutic Modalities: CBT - Identifying and challenging all-or-nothing thinking; promoting realistic, helpful thoughts about change. Psychoeducation - Teaching about the impact of sleep, nutrition, movement, and routine on mental health. Motivational Interviewing - Eliciting personal motivation and exploring readiness for change. Goal-Setting - Supporting SMART goals to build self-efficacy and encourage follow-through.   Chavonne Sforza O Diane Mochizuki, LCSWA 05/14/2024  5:05 PM

## 2024-05-14 NOTE — Plan of Care (Signed)

## 2024-05-14 NOTE — Group Note (Signed)
 Date:  05/14/2024 Time:  8:55 PM  Group Topic/Focus:  Wrap-Up Group:   The focus of this group is to help patients review their daily goal of treatment and discuss progress on daily workbooks.    Participation Level:  Did Not Attend  Participation Quality:  Did Not Attend  Affect:  Did Not Attend  Cognitive:  Did Not Attend  Insight: None  Engagement in Group:  Did Not Attend  Modes of Intervention:  Did Not Attend Additional Comments:  Pt was encouraged to attend wrap up group but did not attend.  [name] 05/14/2024, 8:55 PM

## 2024-05-14 NOTE — Plan of Care (Signed)
   Problem: Education: Goal: Emotional status will improve Outcome: Not Progressing Goal: Mental status will improve Outcome: Not Progressing

## 2024-05-14 NOTE — Progress Notes (Signed)
 [facility] MD Progress Note  05/14/2024 2:48 PM Patricia Branch  MRN:  [id number] Subjective:   18 year old female, lives with her family.  Background history of trauma, ADHD and anxiety disorder.  New onset psychosis in the past three months.  It has progressively gotten worse.  She hears voices of demons saying mean things to her.  She feels like she is possessed by the demons months.  Presented in company of her family for stabilization. Routine labs were essentially normal.  No alcohol or any psychoactive substance on board.  I resumed care of this patient today.  Chart reviewed today.  Nursing staff reports that patient has not been attending to her basic needs.  She is not grooming herself.  She is not eating her meals.  She is not drinking enough fluids.  She is very disorganized.  She attempted to elope from the unit yesterday.  Observed to be responding to internal stimuli.  She slept for 13 hours yesterday.  She required emergency medication.  Seen today.  Patient remains invested with her delusions.  She feels overwhelmed by these feelings.  No command to harm herself or harm anyone else.  No feelings of being taken over to the point that she lost control of her actions.  She had today that there is a strong history of schizophrenia on maternal side of her family.  I discussed aripiprazole  to target psychosis.  She consented after we reviewed the pros and cons.  I have encouraged her to keep ventilating her feelings to staff.    Principal Problem: Schizophreniform disorder (HCC) Diagnosis: Principal Problem:   Schizophreniform disorder (HCC) Active Problems:   Acute psychosis (HCC)  Total Time spent with patient: 30 minutes  Past Psychiatric History:  See H&P  Past Medical History: History reviewed. No pertinent past medical history. History reviewed. No pertinent surgical history. Family History: History reviewed. No pertinent family history. Family Psychiatric  History:  Family  history of schizophrenia on maternal side.  Social History:  Social History   Substance and Sexual Activity  Alcohol Use No     Social History   Substance and Sexual Activity  Drug Use Never    Social History   Socioeconomic History   Marital status: Single    Spouse name: Not on file   Number of children: Not on file   Years of education: Not on file   Highest education level: Not on file  Occupational History   Not on file  Tobacco Use   Smoking status: Never   Smokeless tobacco: Not on file  Vaping Use   Vaping status: Never Used  Substance and Sexual Activity   Alcohol use: No   Drug use: Never   Sexual activity: Not on file  Other Topics Concern   Not on file  Social History Narrative   Not on file   Social Drivers of Health   Financial Resource Strain: Not on file  Food Insecurity: No Food Insecurity (05/12/2024)   Hunger Vital Sign    Worried About Running Out of Food in the Last Year: Never true    Ran Out of Food in the Last Year: Never true  Transportation Needs: No Transportation Needs (05/12/2024)   PRAPARE - Administrator, Civil Service (Medical): No    Lack of Transportation (Non-Medical): No  Physical Activity: Not on file  Stress: Not on file  Social Connections: Not on file     Current Medications: Current Facility-Administered Medications  Medication Dose  Route Frequency Provider Last Rate Last Admin   acetaminophen  (TYLENOL ) tablet 650 mg  650 mg Oral Q6H PRN Ajibola, Ene A, NP       alum & mag hydroxide-simeth (MAALOX/MYLANTA) 200-200-20 MG/5ML suspension 30 mL  30 mL Oral Q4H PRN Ajibola, Ene A, NP       haloperidol  (HALDOL ) tablet 5 mg  5 mg Oral TID PRN Ajibola, Ene A, NP       And   diphenhydrAMINE  (BENADRYL ) capsule 50 mg  50 mg Oral TID PRN Ajibola, Ene A, NP       haloperidol  lactate (HALDOL ) injection 5 mg  5 mg Intramuscular TID PRN Ajibola, Ene A, NP       And   diphenhydrAMINE  (BENADRYL ) injection 50 mg  50 mg  Intramuscular TID PRN Ajibola, Ene A, NP       And   LORazepam  (ATIVAN ) injection 2 mg  2 mg Intramuscular TID PRN Ajibola, Ene A, NP       haloperidol  lactate (HALDOL ) injection 10 mg  10 mg Intramuscular TID PRN Ajibola, Ene A, NP   10 mg at 05/13/24 0758   And   diphenhydrAMINE  (BENADRYL ) injection 50 mg  50 mg Intramuscular TID PRN Ajibola, Ene A, NP   50 mg at 05/13/24 0759   And   LORazepam  (ATIVAN ) injection 2 mg  2 mg Intramuscular TID PRN Ajibola, Ene A, NP   2 mg at 05/13/24 9241   hydrOXYzine  (ATARAX ) tablet 25 mg  25 mg Oral TID PRN Ajibola, Ene A, NP       magnesium  hydroxide (MILK OF MAGNESIA) suspension 30 mL  30 mL Oral Daily PRN Ajibola, Ene A, NP       OLANZapine  zydis (ZYPREXA ) disintegrating tablet 5 mg  5 mg Oral Q12H Zouev, Dmitri, MD   5 mg at 05/13/24 1951   Or   OLANZapine  (ZYPREXA ) injection 5 mg  5 mg Intramuscular Q12H Zouev, Dmitri, MD   5 mg at 05/14/24 9192   traZODone  (DESYREL ) tablet 50 mg  50 mg Oral QHS PRN Ajibola, Ene A, NP        Lab Results:  Results for orders placed or performed during the hospital encounter of 05/12/24 (from the past 48 hours)  RPR     Status: None   Collection Time: 05/13/24  6:31 PM  Result Value Ref Range   RPR Ser Ql NON REACTIVE NON REACTIVE    Comment: Performed at [facility], [address], [place], [place] [address]  Vitamin B12     Status: None   Collection Time: 05/13/24  6:31 PM  Result Value Ref Range   Vitamin B-12 266 180 - 914 pg/mL    Comment: (NOTE) This assay is not validated for testing neonatal or myeloproliferative syndrome specimens for Vitamin B12 levels. Performed at [facility], [address] [place], [place] [address]   Folate     Status: None   Collection Time: 05/13/24  6:31 PM  Result Value Ref Range   Folate 15.6 >5.9 ng/mL    Comment: Performed at [facility], [address] [place], [place] [address]  Lipid panel     Status: Abnormal    Collection Time: 05/13/24  6:31 PM  Result Value Ref Range   Cholesterol 178 0 - 200 mg/dL   Triglycerides 37 <849 mg/dL   HDL 44 >59 mg/dL   Total CHOL/HDL Ratio 4.0 RATIO   VLDL 7 0 -  40 mg/dL   LDL Cholesterol 872 (H) 0 - 99 mg/dL    Comment:        Total Cholesterol/HDL:CHD Risk Coronary Heart Disease Risk Table                     Men   Women  1/2 Average Risk   3.4   3.3  Average Risk       5.0   4.4  2 X Average Risk   9.6   7.1  3 X Average Risk  23.4   11.0        Use the calculated Patient Ratio above and the CHD Risk Table to determine the patient's CHD Risk.        ATP III CLASSIFICATION (LDL):  <100     mg/dL   Optimal  899-870  mg/dL   Near or Above                    Optimal  130-159  mg/dL   Borderline  839-810  mg/dL   High  >809     mg/dL   Very High Performed at [facility], [address], [place], [place] [address]   CBC with Differential/Platelet     Status: Abnormal   Collection Time: 05/13/24  6:31 PM  Result Value Ref Range   WBC 9.6 4.0 - 10.5 K/uL   RBC 4.97 3.87 - 5.11 MIL/uL   Hemoglobin 10.8 (L) 12.0 - 15.0 g/dL   HCT 63.2 63.9 - 53.9 %   MCV 73.8 (L) 80.0 - 100.0 fL   MCH 21.7 (L) 26.0 - 34.0 pg   MCHC 29.4 (L) 30.0 - 36.0 g/dL   RDW 82.9 (H) 88.4 - 84.4 %   Platelets 430 (H) 150 - 400 K/uL   nRBC 0.0 0.0 - 0.2 %   Neutrophils Relative % 69 %   Neutro Abs 6.7 1.7 - 7.7 K/uL   Lymphocytes Relative 20 %   Lymphs Abs 1.9 0.7 - 4.0 K/uL   Monocytes Relative 9 %   Monocytes Absolute 0.8 0.1 - 1.0 K/uL   Eosinophils Relative 1 %   Eosinophils Absolute 0.1 0.0 - 0.5 K/uL   Basophils Relative 1 %   Basophils Absolute 0.1 0.0 - 0.1 K/uL   Immature Granulocytes 0 %   Abs Immature Granulocytes 0.03 0.00 - 0.07 K/uL    Comment: Performed at [facility], [address], [place], [place] [address]  HIV Antibody (routine testing w rflx)     Status: None   Collection Time: 05/13/24  6:31 PM  Result Value  Ref Range   HIV Screen 4th Generation wRfx Non Reactive Non Reactive    Comment: Performed at [facility], [address], [place], [place] [address]  CBC with Differential/Platelet     Status: Abnormal   Collection Time: 05/14/24  6:18 AM  Result Value Ref Range   WBC 9.0 4.0 - 10.5 K/uL   RBC 4.90 3.87 - 5.11 MIL/uL   Hemoglobin 10.8 (L) 12.0 - 15.0 g/dL   HCT 63.2 63.9 - 53.9 %   MCV 74.9 (L) 80.0 - 100.0 fL   MCH 22.0 (L) 26.0 - 34.0 pg   MCHC 29.4 (L) 30.0 - 36.0 g/dL   RDW 82.9 (H) 88.4 - 84.4 %   Platelets 412 (H) 150 - 400 K/uL   nRBC 0.0 0.0 - 0.2 %   Neutrophils Relative % 64 %  Neutro Abs 5.7 1.7 - 7.7 K/uL   Lymphocytes Relative 25 %   Lymphs Abs 2.3 0.7 - 4.0 K/uL   Monocytes Relative 8 %   Monocytes Absolute 0.8 0.1 - 1.0 K/uL   Eosinophils Relative 2 %   Eosinophils Absolute 0.2 0.0 - 0.5 K/uL   Basophils Relative 1 %   Basophils Absolute 0.1 0.0 - 0.1 K/uL   Immature Granulocytes 0 %   Abs Immature Granulocytes 0.02 0.00 - 0.07 K/uL    Comment: Performed at [facility], [address], [place], [place] [address]  Comprehensive metabolic panel with GFR     Status: Abnormal   Collection Time: 05/14/24  6:18 AM  Result Value Ref Range   Sodium 142 135 - 145 mmol/L   Potassium 3.8 3.5 - 5.1 mmol/L   Chloride 106 98 - 111 mmol/L   CO2 24 22 - 32 mmol/L   Glucose, Bld 65 (L) 70 - 99 mg/dL    Comment: Glucose reference range applies only to samples taken after fasting for at least 8 hours.   BUN 24 (H) 6 - 20 mg/dL   Creatinine, Ser 9.17 0.44 - 1.00 mg/dL   Calcium 9.3 8.9 - 89.6 mg/dL   Total Protein 7.6 6.5 - 8.1 g/dL   Albumin 3.5 3.5 - 5.0 g/dL   AST 17 15 - 41 U/L   ALT 13 0 - 44 U/L   Alkaline Phosphatase 60 38 - 126 U/L   Total Bilirubin 0.8 0.0 - 1.2 mg/dL   GFR, Estimated >39 >39 mL/min    Comment: (NOTE) Calculated using the CKD-EPI Creatinine Equation (2021)    Anion gap 12 5 - 15    Comment: Performed at [facility], [address], [place], [place] [address]    Blood Alcohol level:  Lab Results  Component Value Date   [facility] <15 05/10/2024    Metabolic Disorder Labs: Lab Results  Component Value Date   HGBA1C 5.6 05/23/2014   No results found for: PROLACTIN Lab Results  Component Value Date   CHOL 178 05/13/2024   TRIG 37 05/13/2024   HDL 44 05/13/2024   CHOLHDL 4.0 05/13/2024   VLDL 7 05/13/2024   LDLCALC 127 (H) 05/13/2024   LDLCALC 75 05/23/2014    Physical Findings: AIMS:  ,  ,  ,  ,  ,  ,   CIWA:    COWS:     Musculoskeletal: Strength & Muscle Tone: within normal limits Gait & Station: normal Patient leans: N/A  Psychiatric Specialty Exam:  Presentation  General Appearance and behavior:  Overweight, poor grooming, moderate rapport.  Eye Contact: Fair.  Speech: Spontaneous.  Soft spoken.  Mood and Affect  Mood: Overwhelmed by her internal experience.  Affect: Blunted and oriented.  Thought Process  Thought Processes: Linear and goal directed.  Descriptions of Associations:Intact  Orientation:Full (Time, Place and Person)  Thought Content: Persecutory and paranoid delusion.  Delusions of being possessed by demons.  No current suicidal thoughts.  No current homicidal thoughts.  No current thoughts of violence.  Hallucinations: Auditory hallucination  Sensorium  Memory: Unable to assess now as patient is psychotic.  Judgment: Poor.  Insight: Fair.  Executive Functions  Concentration: Distractible.  Attention Span: Poor.  Recall: Unable to assess as patient is psychotic.  Fund of Knowledge: Fair.  Language: Good   Psychomotor Activity  Psychomotor retardation.   Physical Exam: Physical Exam ROS Blood pressure 124/89, pulse (!) 108, temperature (!)  97.5 F (36.4 C), resp. rate 16, height 5' 5 (1.651 m), weight 109.6 kg, SpO2 100%. Body mass index is 40.2 kg/m.   Treatment Plan Summary: Patient with  new onset psychosis which has evolved in the past couple of months.  No history of substance use disorder.  Had been on stimulant medication for years without having psychosis.  There is a family history of schizophrenia.  CT of the head obtained today.  We will initiate aripiprazole  and evaluate him further.  1.  Aripiprazole  10 mg daily.  We will increase as tolerated/needed. 2.  Continue to encourage oral intake of food and fluids. 3.  Continue to encourage personal hygiene and grooming. 4.  Continue to monitor mood behavior and interaction with others. 5.  Social worker will coordinate discharge and aftercare planning.  Jerrell DELENA Forehand, MD 05/14/2024, 2:48 PM

## 2024-05-14 NOTE — BH IP Treatment Plan (Signed)
 Interdisciplinary Treatment and Diagnostic Plan Update  05/14/2024 Time of Session: 11:05am Patricia Branch MRN: [id number]  Principal Diagnosis: Schizophreniform disorder [facility])  Secondary Diagnoses: Principal Problem:   Schizophreniform disorder (HCC) Active Problems:   Acute psychosis (HCC)   Current Medications:  Current Facility-Administered Medications  Medication Dose Route Frequency Provider Last Rate Last Admin   acetaminophen  (TYLENOL ) tablet 650 mg  650 mg Oral Q6H PRN Ajibola, Ene A, NP       alum & mag hydroxide-simeth (MAALOX/MYLANTA) 200-200-20 MG/5ML suspension 30 mL  30 mL Oral Q4H PRN Ajibola, Ene A, NP       ARIPiprazole  (ABILIFY ) tablet 10 mg  10 mg Oral Daily Izediuno, Jerrell LABOR, MD   10 mg at 05/14/24 1523   haloperidol  (HALDOL ) tablet 5 mg  5 mg Oral TID PRN Ajibola, Ene A, NP       And   diphenhydrAMINE  (BENADRYL ) capsule 50 mg  50 mg Oral TID PRN Ajibola, Ene A, NP       haloperidol  lactate (HALDOL ) injection 5 mg  5 mg Intramuscular TID PRN Ajibola, Ene A, NP       And   diphenhydrAMINE  (BENADRYL ) injection 50 mg  50 mg Intramuscular TID PRN Ajibola, Ene A, NP       And   LORazepam  (ATIVAN ) injection 2 mg  2 mg Intramuscular TID PRN Ajibola, Ene A, NP       haloperidol  lactate (HALDOL ) injection 10 mg  10 mg Intramuscular TID PRN Ajibola, Ene A, NP   10 mg at 05/13/24 0758   And   diphenhydrAMINE  (BENADRYL ) injection 50 mg  50 mg Intramuscular TID PRN Ajibola, Ene A, NP   50 mg at 05/13/24 0759   And   LORazepam  (ATIVAN ) injection 2 mg  2 mg Intramuscular TID PRN Ajibola, Ene A, NP   2 mg at 05/13/24 9241   hydrOXYzine  (ATARAX ) tablet 25 mg  25 mg Oral TID PRN Ajibola, Ene A, NP       magnesium  hydroxide (MILK OF MAGNESIA) suspension 30 mL  30 mL Oral Daily PRN Ajibola, Ene A, NP       OLANZapine  zydis (ZYPREXA ) disintegrating tablet 5 mg  5 mg Oral Q12H Zouev, Dmitri, MD   5 mg at 05/13/24 1951   Or   OLANZapine  (ZYPREXA ) injection 5 mg  5 mg Intramuscular  Q12H Zouev, Dmitri, MD   5 mg at 05/14/24 9192   traZODone  (DESYREL ) tablet 50 mg  50 mg Oral QHS PRN Ajibola, Ene A, NP       PTA Medications: No medications prior to admission.    Patient Stressors: Other: hallucinations, delusions    Patient Strengths: Average or above average intelligence  Supportive family/friends   Treatment Modalities: Medication Management, Group therapy, Case management,  1 to 1 session with clinician, Psychoeducation, Recreational therapy.   Physician Treatment Plan for Primary Diagnosis: Schizophreniform disorder (HCC) Long Term Goal(s): Improvement in symptoms so as ready for discharge   Short Term Goals: Ability to identify changes in lifestyle to reduce recurrence of condition will improve Ability to verbalize feelings will improve Ability to disclose and discuss suicidal ideas Ability to demonstrate self-control will improve Ability to identify and develop effective coping behaviors will improve Ability to maintain clinical measurements within normal limits will improve Compliance with prescribed medications will improve Ability to identify triggers associated with substance abuse/mental health issues will improve  Medication Management: Evaluate patient's response, side effects, and tolerance of medication regimen.  Therapeutic Interventions:  1 to 1 sessions, Unit Group sessions and Medication administration.  Evaluation of Outcomes: Not Progressing  Physician Treatment Plan for Secondary Diagnosis: Principal Problem:   Schizophreniform disorder (HCC) Active Problems:   Acute psychosis (HCC)  Long Term Goal(s): Improvement in symptoms so as ready for discharge   Short Term Goals: Ability to identify changes in lifestyle to reduce recurrence of condition will improve Ability to verbalize feelings will improve Ability to disclose and discuss suicidal ideas Ability to demonstrate self-control will improve Ability to identify and develop  effective coping behaviors will improve Ability to maintain clinical measurements within normal limits will improve Compliance with prescribed medications will improve Ability to identify triggers associated with substance abuse/mental health issues will improve     Medication Management: Evaluate patient's response, side effects, and tolerance of medication regimen.  Therapeutic Interventions: 1 to 1 sessions, Unit Group sessions and Medication administration.  Evaluation of Outcomes: Not Progressing   RN Treatment Plan for Primary Diagnosis: Schizophreniform disorder (HCC) Long Term Goal(s): Knowledge of disease and therapeutic regimen to maintain health will improve  Short Term Goals: Ability to remain free from injury will improve, Ability to verbalize frustration and anger appropriately will improve, Ability to demonstrate self-control, Ability to participate in decision making will improve, Ability to verbalize feelings will improve, Ability to disclose and discuss suicidal ideas, and Ability to identify and develop effective coping behaviors will improve  Medication Management: RN will administer medications as ordered by provider, will assess and evaluate patient's response and provide education to patient for prescribed medication. RN will report any adverse and/or side effects to prescribing provider.  Therapeutic Interventions: 1 on 1 counseling sessions, Psychoeducation, Medication administration, Evaluate responses to treatment, Monitor vital signs and CBGs as ordered, Perform/monitor CIWA, COWS, AIMS and Fall Risk screenings as ordered, Perform wound care treatments as ordered.  Evaluation of Outcomes: Not Progressing   LCSW Treatment Plan for Primary Diagnosis: Schizophreniform disorder [facility]) Long Term Goal(s): Safe transition to appropriate next level of care at discharge, Engage patient in therapeutic group addressing interpersonal concerns.  Short Term Goals: Engage  patient in aftercare planning with referrals and resources, Increase social support, Increase ability to appropriately verbalize feelings, Facilitate acceptance of mental health diagnosis and concerns, Facilitate patient progression through stages of change regarding substance use diagnoses and concerns, and Identify triggers associated with mental health/substance abuse issues  Therapeutic Interventions: Assess for all discharge needs, 1 to 1 time with Social worker, Explore available resources and support systems, Assess for adequacy in community support network, Educate family and significant other(s) on suicide prevention, Complete Psychosocial Assessment, Interpersonal group therapy.  Evaluation of Outcomes: Not Progressing   Progress in Treatment: Attending groups: No. Participating in groups: No. Taking medication as prescribed: Yes. Toleration medication: Yes. Family/Significant other contact made: No, will contact:  pt's family once she provides consent to do so. Patient understands diagnosis: Yes. Discussing patient identified problems/goals with staff: Yes. Medical problems stabilized or resolved: Yes. Denies suicidal/homicidal ideation: Yes. Issues/concerns per patient self-inventory: No.  Patient Goals:  Not really anything I want to work on  Discharge Plan or Barriers: Pt plans to return home after discharged.  Reason for Continuation of Hospitalization: Hallucinations Medication stabilization  Estimated Length of Stay: 5-7 days  Last 3 [place] Suicide Severity Risk Score: Flowsheet Row [facility] (Current[facility]10/2024 in [facility]  C-SSRS RISK CATEGORY High Risk High Risk    Last  PHQ 2/9 Scores:     No data to display          Scribe for Treatment Team: Irineo Gaulin M Retal Tonkinson, LCSWA 05/14/2024 3:37 PM

## 2024-05-15 DIAGNOSIS — F23 Brief psychotic disorder: Secondary | ICD-10-CM | POA: Diagnosis not present

## 2024-05-15 LAB — GLUCOSE, CAPILLARY: Glucose-Capillary: 93 mg/dL (ref 70–99)

## 2024-05-15 MED ORDER — ARIPIPRAZOLE 10 MG PO TABS
20.0000 mg | ORAL_TABLET | Freq: Every day | ORAL | Status: DC
  Administered 2024-05-15 (×2): 20 mg via ORAL
  Filled 2024-05-15: qty 2

## 2024-05-15 MED ORDER — ARIPIPRAZOLE 10 MG PO TABS: 10.0000 mg | ORAL_TABLET | Freq: Every day | ORAL | Status: DC

## 2024-05-15 NOTE — Progress Notes (Signed)
   05/15/24 1000  Psych Admission Type (Psych Patients Only)  Admission Status Involuntary  Psychosocial Assessment  Patient Complaints Suspiciousness  Eye Contact Brief  Facial Expression Worried  Affect Preoccupied  Speech Soft  Interaction Isolative  Motor Activity Slow  Appearance/Hygiene In scrubs  Behavior Characteristics Guarded  Mood Suspicious  Thought Process  Coherency Disorganized  Content Paranoia  Delusions Paranoid  Perception Hallucinations  Hallucination Auditory;Visual  Judgment Impaired  Confusion Mild  Danger to Self  Current suicidal ideation? Denies  Danger to Others  Danger to Others None reported or observed

## 2024-05-15 NOTE — Group Note (Signed)
 Date:  05/15/2024 Time:  9:01 PM  Group Topic/Focus:  Wrap-Up Group:   The focus of this group is to help patients review their daily goal of treatment and discuss progress on daily workbooks.    Participation Level:  Did Not Attend   Patricia Branch 05/15/2024, 9:01 PM

## 2024-05-15 NOTE — Progress Notes (Addendum)
   05/15/24 2050  Psych Admission Type (Psych Patients Only)  Admission Status Involuntary  Psychosocial Assessment  Patient Complaints Suspiciousness  Eye Contact Brief  Facial Expression Worried  Affect Preoccupied  Speech Soft  Interaction Isolative  Motor Activity Slow  Appearance/Hygiene In scrubs (pt took a shower)  Behavior Characteristics Guarded  Mood Suspicious  Aggressive Behavior  Effect No apparent injury  Thought Process  Coherency Disorganized  Content Paranoia  Delusions Paranoid  Perception Derealization  Hallucination Auditory;Visual  Judgment Impaired  Confusion Mild  Danger to Self  Current suicidal ideation? Denies

## 2024-05-15 NOTE — Progress Notes (Signed)
 Chaplains received a spiritual care consult to provide support for Patricia Branch.  Jaiona stated that God is speaking to her and telling her that she will go to heaven.  This has been a relief to her because she feels she is fighting against the devil.  She prays regularly but stated that she does not need any additional support at this time.

## 2024-05-15 NOTE — Plan of Care (Signed)

## 2024-05-15 NOTE — BHH Group Notes (Signed)
 Adult Psychoeducational Group Note  Date:  05/15/2024 Time:  9:59 AM  Group Topic/Focus:  Goals Group:   The focus of this group is to help patients establish daily goals to achieve during treatment and discuss how the patient can incorporate goal setting into their daily lives to aide in recovery.  Participation Level:  Did Not Attend  Participation Quality:  na  Affect:  na  Cognitive:  na  Insight: na  Engagement in Group:  na  Modes of Intervention:  na  Additional Comments:  Did not attend group  Keymoni Mccaster Lee 05/15/2024, 9:59 AM

## 2024-05-15 NOTE — Group Note (Signed)
 Date:  05/15/2024 Time:  9:01 PM  Group Topic/Focus:  Wrap-Up Group:   The focus of this group is to help patients review their daily goal of treatment and discuss progress on daily workbooks.    Participation Level:  Did Not Attend   Nikitha Mode Dacosta 05/15/2024, 9:01 PM

## 2024-05-15 NOTE — Plan of Care (Signed)
   Problem: Education: Goal: Emotional status will improve Outcome: Not Progressing Goal: Mental status will improve Outcome: Not Progressing

## 2024-05-15 NOTE — Progress Notes (Signed)
 [facility] MD Progress Note  05/15/2024 3:34 PM Patricia Branch  MRN:  [id number] Subjective:   18 year old female, lives with her family.  Background history of trauma, ADHD and anxiety disorder.  New onset psychosis in the past three months.  It has progressively gotten worse.  She hears voices of demons saying mean things to her.  She feels like she is possessed by the demons months.  Presented in company of her family for stabilization. Routine labs were essentially normal.  No alcohol or any psychoactive substance on board.  Chart reviewed today.  Patient discussed at multidisciplinary team meeting.  Nursing staff reports that patient slept for 8 hours.  When she got up this morning, she threw herself to the ground and required as needed medication.  Patient has been adherent with recommended medicine.  She remains isolative in her room.  Seen today.  Patient tolerated recent introduction of aripiprazole  without any adverse effects.  States that she hears less of the demons.  She is hearing the voice of God giving her encouragement.  Patient states that the demons were possessing her body and took over control when she fell to the floor.  No command to harm anybody.  No command to harm herself.  Encouraged to keep taking her medicines as they will help her feel better.  Encouraged to groom self today and attend unit groups.   Principal Problem: Schizophreniform disorder (HCC) Diagnosis: Principal Problem:   Schizophreniform disorder (HCC) Active Problems:   Acute psychosis (HCC)  Total Time spent with patient: 30 minutes  Past Psychiatric History:  See H&P  Past Medical History: History reviewed. No pertinent past medical history. History reviewed. No pertinent surgical history. Family History: History reviewed. No pertinent family history. Family Psychiatric  History:  Family history of schizophrenia on maternal side.  Social History:  Social History   Substance and Sexual Activity   Alcohol Use No     Social History   Substance and Sexual Activity  Drug Use Never    Social History   Socioeconomic History   Marital status: Single    Spouse name: Not on file   Number of children: Not on file   Years of education: Not on file   Highest education level: Not on file  Occupational History   Not on file  Tobacco Use   Smoking status: Never   Smokeless tobacco: Not on file  Vaping Use   Vaping status: Never Used  Substance and Sexual Activity   Alcohol use: No   Drug use: Never   Sexual activity: Not on file  Other Topics Concern   Not on file  Social History Narrative   Not on file   Social Drivers of Health   Financial Resource Strain: Not on file  Food Insecurity: No Food Insecurity (05/12/2024)   Hunger Vital Sign    Worried About Running Out of Food in the Last Year: Never true    Ran Out of Food in the Last Year: Never true  Transportation Needs: No Transportation Needs (05/12/2024)   PRAPARE - Administrator, Civil Service (Medical): No    Lack of Transportation (Non-Medical): No  Physical Activity: Not on file  Stress: Not on file  Social Connections: Not on file     Current Medications: Current Facility-Administered Medications  Medication Dose Route Frequency Provider Last Rate Last Admin   acetaminophen  (TYLENOL ) tablet 650 mg  650 mg Oral Q6H PRN Ajibola, Ene A, NP  alum & mag hydroxide-simeth (MAALOX/MYLANTA) 200-200-20 MG/5ML suspension 30 mL  30 mL Oral Q4H PRN Ajibola, Ene A, NP       ARIPiprazole  (ABILIFY ) tablet 10 mg  10 mg Oral QHS Jakarri Lesko, Jerrell LABOR, MD       ARIPiprazole  (ABILIFY ) tablet 20 mg  20 mg Oral QHS Shanekia Latella A, MD       haloperidol  (HALDOL ) tablet 5 mg  5 mg Oral TID PRN Ajibola, Ene A, NP       And   diphenhydrAMINE  (BENADRYL ) capsule 50 mg  50 mg Oral TID PRN Ajibola, Ene A, NP       haloperidol  lactate (HALDOL ) injection 5 mg  5 mg Intramuscular TID PRN Ajibola, Ene A, NP   5 mg at  05/15/24 0645   And   diphenhydrAMINE  (BENADRYL ) injection 50 mg  50 mg Intramuscular TID PRN Ajibola, Ene A, NP       And   LORazepam  (ATIVAN ) injection 2 mg  2 mg Intramuscular TID PRN Ajibola, Ene A, NP       haloperidol  lactate (HALDOL ) injection 10 mg  10 mg Intramuscular TID PRN Ajibola, Ene A, NP   10 mg at 05/13/24 9241   And   diphenhydrAMINE  (BENADRYL ) injection 50 mg  50 mg Intramuscular TID PRN Ajibola, Ene A, NP   50 mg at 05/15/24 9350   And   LORazepam  (ATIVAN ) injection 2 mg  2 mg Intramuscular TID PRN Ajibola, Ene A, NP   2 mg at 05/13/24 0758   hydrOXYzine  (ATARAX ) tablet 25 mg  25 mg Oral TID PRN Ajibola, Ene A, NP       magnesium  hydroxide (MILK OF MAGNESIA) suspension 30 mL  30 mL Oral Daily PRN Ajibola, Ene A, NP       OLANZapine  zydis (ZYPREXA ) disintegrating tablet 5 mg  5 mg Oral Q12H Zouev, Dmitri, MD   5 mg at 05/15/24 9192   Or   OLANZapine  (ZYPREXA ) injection 5 mg  5 mg Intramuscular Q12H Zouev, Dmitri, MD   5 mg at 05/14/24 9192   traZODone  (DESYREL ) tablet 50 mg  50 mg Oral QHS PRN Ajibola, Ene A, NP        Lab Results:  Results for orders placed or performed during the hospital encounter of 05/12/24 (from the past 48 hours)  RPR     Status: None   Collection Time: 05/13/24  6:31 PM  Result Value Ref Range   RPR Ser Ql NON REACTIVE NON REACTIVE    Comment: Performed at [facility], [address], [place], [place] [address]  Vitamin B12     Status: None   Collection Time: 05/13/24  6:31 PM  Result Value Ref Range   Vitamin B-12 266 180 - 914 pg/mL    Comment: (NOTE) This assay is not validated for testing neonatal or myeloproliferative syndrome specimens for Vitamin B12 levels. Performed at [facility], [address] [place], [place] [address]   Folate     Status: None   Collection Time: 05/13/24  6:31 PM  Result Value Ref Range   Folate 15.6 >5.9 ng/mL    Comment: Performed at [facility], [address], [place], [place] [address]  Hemoglobin A1c     Status: Abnormal   Collection Time: 05/13/24  6:31 PM  Result Value Ref Range   Hgb A1c MFr Bld 5.7 (H) 4.8 - 5.6 %    Comment: (NOTE)  Prediabetes: 5.7 - 6.4         Diabetes: >6.4         Glycemic control for adults with diabetes: <7.0    Mean Plasma Glucose 117 mg/dL    Comment: (NOTE) Performed At: [facility] [address] [place], [place] [id number] Jennette Shorter MD Ey:[id number]   Lipid panel     Status: Abnormal   Collection Time: 05/13/24  6:31 PM  Result Value Ref Range   Cholesterol 178 0 - 200 mg/dL   Triglycerides 37 <849 mg/dL   HDL 44 >59 mg/dL   Total CHOL/HDL Ratio 4.0 RATIO   VLDL 7 0 - 40 mg/dL   LDL Cholesterol 872 (H) 0 - 99 mg/dL    Comment:        Total Cholesterol/HDL:CHD Risk Coronary Heart Disease Risk Table                     Men   Women  1/2 Average Risk   3.4   3.3  Average Risk       5.0   4.4  2 X Average Risk   9.6   7.1  3 X Average Risk  23.4   11.0        Use the calculated Patient Ratio above and the CHD Risk Table to determine the patient's CHD Risk.        ATP III CLASSIFICATION (LDL):  <100     mg/dL   Optimal  899-870  mg/dL   Near or Above                    Optimal  130-159  mg/dL   Borderline  839-810  mg/dL   High  >809     mg/dL   Very High Performed at [facility], [address] [place], [place] [address]   CBC with Differential/Platelet     Status: Abnormal   Collection Time: 05/13/24  6:31 PM  Result Value Ref Range   WBC 9.6 4.0 - 10.5 K/uL   RBC 4.97 3.87 - 5.11 MIL/uL   Hemoglobin 10.8 (L) 12.0 - 15.0 g/dL   HCT 63.2 63.9 - 53.9 %   MCV 73.8 (L) 80.0 - 100.0 fL   MCH 21.7 (L) 26.0 - 34.0 pg   MCHC 29.4 (L) 30.0 - 36.0 g/dL   RDW 82.9 (H) 88.4 - 84.4 %   Platelets 430 (H) 150 - 400 K/uL   nRBC 0.0 0.0 - 0.2 %   Neutrophils Relative % 69 %   Neutro Abs 6.7 1.7 - 7.7 K/uL   Lymphocytes Relative 20 %   Lymphs Abs  1.9 0.7 - 4.0 K/uL   Monocytes Relative 9 %   Monocytes Absolute 0.8 0.1 - 1.0 K/uL   Eosinophils Relative 1 %   Eosinophils Absolute 0.1 0.0 - 0.5 K/uL   Basophils Relative 1 %   Basophils Absolute 0.1 0.0 - 0.1 K/uL   Immature Granulocytes 0 %   Abs Immature Granulocytes 0.03 0.00 - 0.07 K/uL    Comment: Performed at [facility], [address] [place], [place] [address]  HIV Antibody (routine testing w rflx)     Status: None   Collection Time: 05/13/24  6:31 PM  Result Value Ref Range   HIV Screen 4th Generation wRfx Non Reactive Non Reactive    Comment: Performed at [facility], [address] [place], [place] [address]  CBC  with Differential/Platelet     Status: Abnormal   Collection Time: 05/14/24  6:18 AM  Result Value Ref Range   WBC 9.0 4.0 - 10.5 K/uL   RBC 4.90 3.87 - 5.11 MIL/uL   Hemoglobin 10.8 (L) 12.0 - 15.0 g/dL   HCT 63.2 63.9 - 53.9 %   MCV 74.9 (L) 80.0 - 100.0 fL   MCH 22.0 (L) 26.0 - 34.0 pg   MCHC 29.4 (L) 30.0 - 36.0 g/dL   RDW 82.9 (H) 88.4 - 84.4 %   Platelets 412 (H) 150 - 400 K/uL   nRBC 0.0 0.0 - 0.2 %   Neutrophils Relative % 64 %   Neutro Abs 5.7 1.7 - 7.7 K/uL   Lymphocytes Relative 25 %   Lymphs Abs 2.3 0.7 - 4.0 K/uL   Monocytes Relative 8 %   Monocytes Absolute 0.8 0.1 - 1.0 K/uL   Eosinophils Relative 2 %   Eosinophils Absolute 0.2 0.0 - 0.5 K/uL   Basophils Relative 1 %   Basophils Absolute 0.1 0.0 - 0.1 K/uL   Immature Granulocytes 0 %   Abs Immature Granulocytes 0.02 0.00 - 0.07 K/uL    Comment: Performed at [facility], [address], [place], [place] [address]  Comprehensive metabolic panel with GFR     Status: Abnormal   Collection Time: 05/14/24  6:18 AM  Result Value Ref Range   Sodium 142 135 - 145 mmol/L   Potassium 3.8 3.5 - 5.1 mmol/L   Chloride 106 98 - 111 mmol/L   CO2 24 22 - 32 mmol/L   Glucose, Bld 65 (L) 70 - 99 mg/dL    Comment: Glucose reference range applies only  to samples taken after fasting for at least 8 hours.   BUN 24 (H) 6 - 20 mg/dL   Creatinine, Ser 9.17 0.44 - 1.00 mg/dL   Calcium 9.3 8.9 - 89.6 mg/dL   Total Protein 7.6 6.5 - 8.1 g/dL   Albumin 3.5 3.5 - 5.0 g/dL   AST 17 15 - 41 U/L   ALT 13 0 - 44 U/L   Alkaline Phosphatase 60 38 - 126 U/L   Total Bilirubin 0.8 0.0 - 1.2 mg/dL   GFR, Estimated >39 >39 mL/min    Comment: (NOTE) Calculated using the CKD-EPI Creatinine Equation (2021)    Anion gap 12 5 - 15    Comment: Performed at [facility], [address] [place], [place] [address]  Glucose, capillary     Status: None   Collection Time: 05/15/24  6:47 AM  Result Value Ref Range   Glucose-Capillary 93 70 - 99 mg/dL    Comment: Glucose reference range applies only to samples taken after fasting for at least 8 hours.    Blood Alcohol level:  Lab Results  Component Value Date   [facility] <15 05/10/2024    Metabolic Disorder Labs: Lab Results  Component Value Date   HGBA1C 5.7 (H) 05/13/2024   MPG 117 05/13/2024   No results found for: PROLACTIN Lab Results  Component Value Date   CHOL 178 05/13/2024   TRIG 37 05/13/2024   HDL 44 05/13/2024   CHOLHDL 4.0 05/13/2024   VLDL 7 05/13/2024   LDLCALC 127 (H) 05/13/2024   LDLCALC 75 05/23/2014    Physical Findings: AIMS:  ,  ,  ,  ,  ,  ,   CIWA:    COWS:     Musculoskeletal: Strength & Muscle Tone: within normal  limits Gait & Station: normal Patient leans: N/A  Psychiatric Specialty Exam:  Presentation  General Appearance and behavior:  Overweight, poor grooming, internally preoccupied.  No EPS.  Eye Contact: Fair.  Speech: Spontaneous.  Soft spoken.  Mood and Affect  Mood: Overwhelmed by her internal experience.  Affect: Blunted and oriented.  Thought Process  Thought Processes: Linear and goal directed.  Descriptions of Associations:Intact  Orientation:Full (Time, Place and Person)  Thought Content: Persecutory and  paranoid delusion.  Delusions of being possessed by demons.  No current suicidal thoughts.  No current homicidal thoughts.  No current thoughts of violence.  Hallucinations: Auditory hallucination  Sensorium  Memory: Unable to assess now as patient is psychotic.  Judgment: Poor.  Insight: Fair.  Executive Functions  Concentration: Distractible.  Attention Span: Poor.  Recall: Unable to assess as patient is psychotic.  Fund of Knowledge: Fair.  Language: Good   Psychomotor Activity  Psychomotor retardation.   Physical Exam: Physical Exam ROS Blood pressure 124/89, pulse (!) 108, temperature (!) 97.5 F (36.4 C), resp. rate 16, height 5' 5 (1.651 m), weight 109.6 kg, SpO2 100%. Body mass index is 40.2 kg/m.   Treatment Plan Summary: Patient tolerated recent introduction of aripiprazole  without any adverse effects.  She remains internally preoccupied.  We will optimize her dose today and continue to evaluate her.  1.  Aripiprazole  20 mg at bedtime. 2.  Continue to encourage oral intake of food and fluids. 3.  Continue to encourage personal hygiene and grooming. 4.  Continue to monitor mood behavior and interaction with others. 5.  Social worker will coordinate discharge and aftercare planning.  Jerrell DELENA Forehand, MD 05/15/2024, 3:34 PM

## 2024-05-15 NOTE — Progress Notes (Addendum)
   05/15/24 0700  What Happened  Was fall witnessed? Yes  Who witnessed fall? Litha Lamartina,RN Benajamine,RN  Patients activity before fall ambulating-assisted  Point of contact hip/leg  Was patient injured? No  Provider Notification  Provider Name/Title Dr.P  Date Provider Notified 05/15/24  Time Provider Notified [phone]  Method of Notification Face-to-face  Notification Reason Fall  Provider response At bedside  Date of Provider Response 05/08/24  Time of Provider Response [phone]  Follow Up  Family notified No - patient refusal  Adult Fall Risk Assessment  Risk Factor Category (scoring not indicated) High fall risk per protocol (document High fall risk)  Patient Fall Risk Level High fall risk  Adult Fall Risk Interventions  Required Bundle Interventions *See Row Information* High fall risk - low, moderate, and high requirements implemented  Fall intervention(s) refused/Patient educated regarding refusal Nonskid socks   At [phone] pt refused to leave dayroom after having morning vitals. Pt stated God told her she could not move. Pt educated on ambulating in the hallway with nonskid shocks, but pt refused to put socks on earlier in the shift. Staff member escorted pt to room with two people assist.  As entering the room pt began to drop her weight to floor. Staff guided pt to her knees and pt laid back on her back. m medication administered evident by the [facility]. MD notified of fall.

## 2024-05-15 NOTE — Group Note (Unsigned)
 Date:  05/15/2024 Time:  7:49 PM  Group Topic/Focus:  Wrap-Up Group:   The focus of this group is to help patients review their daily goal of treatment and discuss progress on daily workbooks.     Participation Level:  {BHH PARTICIPATION OZCZO:77735}  Participation Quality:  {BHH PARTICIPATION QUALITY:22265}  Affect:  {BHH AFFECT:22266}  Cognitive:  {BHH COGNITIVE:22267}  Insight: {BHH Insight2:20797}  Engagement in Group:  {BHH ENGAGEMENT IN HMNLE:77731}  Modes of Intervention:  {BHH MODES OF INTERVENTION:22269}  Additional Comments:  ***  Gwenn Nobie [place] 05/15/2024, 7:49 PM

## 2024-05-15 NOTE — BHH Suicide Risk Assessment (Signed)
 [facility] INPATIENT:  Family/Significant Other Suicide Prevention Education  Suicide Prevention Education:  Contact Attempts: Shaneal Barasch (mom) [phone], (name of family member/significant other) has been identified by the patient as the family member/significant other with whom the patient will be residing, and identified as the person(s) who will aid the patient in the event of a mental health crisis.    Date and time of first attempt: 05/15/2024 / 6:02 PM  The phone rang but there was no answer.  Voicemail wasn't an option.  Jassen Sarver O Danny Yackley, LCSWA 05/15/2024, 6:02 PM

## 2024-05-16 DIAGNOSIS — F23 Brief psychotic disorder: Secondary | ICD-10-CM | POA: Diagnosis not present

## 2024-05-16 MED ORDER — ARIPIPRAZOLE 15 MG PO TABS
30.0000 mg | ORAL_TABLET | Freq: Every day | ORAL | Status: DC
  Administered 2024-05-16 – 2024-05-17 (×3): 30 mg via ORAL
  Filled 2024-05-16 (×2): qty 2

## 2024-05-16 MED ORDER — OLANZAPINE 5 MG PO TABS
5.0000 mg | ORAL_TABLET | Freq: Every day | ORAL | Status: DC
  Administered 2024-05-16 (×2): 5 mg via ORAL
  Filled 2024-05-16: qty 1

## 2024-05-16 MED ORDER — ARIPIPRAZOLE ER 400 MG IM PRSY
400.0000 mg | PREFILLED_SYRINGE | INTRAMUSCULAR | Status: DC
  Administered 2024-05-17: 400 mg via INTRAMUSCULAR
  Filled 2024-05-16: qty 400

## 2024-05-16 NOTE — Group Note (Signed)
 Date:  05/16/2024 Time:  8:38 PM  Group Topic/Focus:  Wrap-Up Group:   The focus of this group is to help patients review their daily goal of treatment and discuss progress on daily workbooks.    Participation Level:  Did Not Attend   Palyn Scrima Dacosta 05/16/2024, 8:38 PM

## 2024-05-16 NOTE — Plan of Care (Signed)
   Problem: Education: Goal: Emotional status will improve Outcome: Progressing Goal: Mental status will improve Outcome: Progressing Goal: Verbalization of understanding the information provided will improve Outcome: Progressing

## 2024-05-16 NOTE — Progress Notes (Signed)
 [facility] MD Progress Note  05/16/2024 2:53 PM Patricia Branch  MRN:  [id number] Subjective:   18 year old female, lives with her family.  Background history of trauma, ADHD and anxiety disorder.  New onset psychosis in the past three months.  It has progressively gotten worse.  She hears voices of demons saying mean things to her.  She feels like she is possessed by the demons months.  Presented in company of her family for stabilization. Routine labs were essentially normal.  No alcohol or any psychoactive substance on board.  Chart reviewed today.  Patient discussed at multidisciplinary team meeting.  Nursing staff reports that patient has been adherent with her medications.  She has not required any PRNs lately.  She slept for 4.75 hours.  She states that the voices are better.  She is less hyperreligious.  Seen today.  Patient has tolerated her medicines well.  She received 30 mg of aripiprazole  last night.  States that the voices less in intensity or frequency.  She has not experienced the demons today.  Reassurance from God has been less today.  Patient states that her parents are visiting this evening.  I encouraged her to ask them to bring her clothes.  Patient is not endorsing any thoughts of violence.  No passivity of well today.  She is more spontaneous today.  I explored long-acting injectable Abilify  with her.  She consented after we reviewed the pros and cons.  We will initiate tomorrow.    Principal Problem: Schizophreniform disorder (HCC) Diagnosis: Principal Problem:   Schizophreniform disorder (HCC) Active Problems:   Acute psychosis (HCC)  Total Time spent with patient: 30 minutes  Past Psychiatric History:  See H&P  Past Medical History: History reviewed. No pertinent past medical history. History reviewed. No pertinent surgical history. Family History: History reviewed. No pertinent family history. Family Psychiatric  History:  Family history of schizophrenia on maternal  side.  Social History:  Social History   Substance and Sexual Activity  Alcohol Use No     Social History   Substance and Sexual Activity  Drug Use Never    Social History   Socioeconomic History   Marital status: Single    Spouse name: Not on file   Number of children: Not on file   Years of education: Not on file   Highest education level: Not on file  Occupational History   Not on file  Tobacco Use   Smoking status: Never   Smokeless tobacco: Not on file  Vaping Use   Vaping status: Never Used  Substance and Sexual Activity   Alcohol use: No   Drug use: Never   Sexual activity: Not on file  Other Topics Concern   Not on file  Social History Narrative   Not on file   Social Drivers of Health   Financial Resource Strain: Not on file  Food Insecurity: No Food Insecurity (05/12/2024)   Hunger Vital Sign    Worried About Running Out of Food in the Last Year: Never true    Ran Out of Food in the Last Year: Never true  Transportation Needs: No Transportation Needs (05/12/2024)   PRAPARE - Administrator, Civil Service (Medical): No    Lack of Transportation (Non-Medical): No  Physical Activity: Not on file  Stress: Not on file  Social Connections: Not on file     Current Medications: Current Facility-Administered Medications  Medication Dose Route Frequency Provider Last Rate Last Admin   acetaminophen  (TYLENOL )  tablet 650 mg  650 mg Oral Q6H PRN Ajibola, Ene A, NP       alum & mag hydroxide-simeth (MAALOX/MYLANTA) 200-200-20 MG/5ML suspension 30 mL  30 mL Oral Q4H PRN Ajibola, Ene A, NP       ARIPiprazole  (ABILIFY ) tablet 30 mg  30 mg Oral QHS Breck Maryland A, MD       haloperidol  (HALDOL ) tablet 5 mg  5 mg Oral TID PRN Ajibola, Ene A, NP       And   diphenhydrAMINE  (BENADRYL ) capsule 50 mg  50 mg Oral TID PRN Ajibola, Ene A, NP       haloperidol  lactate (HALDOL ) injection 5 mg  5 mg Intramuscular TID PRN Ajibola, Ene A, NP   5 mg at 05/15/24  0645   And   diphenhydrAMINE  (BENADRYL ) injection 50 mg  50 mg Intramuscular TID PRN Ajibola, Ene A, NP       And   LORazepam  (ATIVAN ) injection 2 mg  2 mg Intramuscular TID PRN Ajibola, Ene A, NP       haloperidol  lactate (HALDOL ) injection 10 mg  10 mg Intramuscular TID PRN Ajibola, Ene A, NP   10 mg at 05/13/24 0758   And   diphenhydrAMINE  (BENADRYL ) injection 50 mg  50 mg Intramuscular TID PRN Ajibola, Ene A, NP   50 mg at 05/15/24 9350   And   LORazepam  (ATIVAN ) injection 2 mg  2 mg Intramuscular TID PRN Ajibola, Ene A, NP   2 mg at 05/13/24 9241   hydrOXYzine  (ATARAX ) tablet 25 mg  25 mg Oral TID PRN Ajibola, Ene A, NP       magnesium  hydroxide (MILK OF MAGNESIA) suspension 30 mL  30 mL Oral Daily PRN Ajibola, Ene A, NP       OLANZapine  (ZYPREXA ) injection 5 mg  5 mg Intramuscular Q12H Zouev, Dmitri, MD   5 mg at 05/14/24 9192   traZODone  (DESYREL ) tablet 50 mg  50 mg Oral QHS PRN Ajibola, Ene A, NP        Lab Results:  Results for orders placed or performed during the hospital encounter of 05/12/24 (from the past 48 hours)  Glucose, capillary     Status: None   Collection Time: 05/15/24  6:47 AM  Result Value Ref Range   Glucose-Capillary 93 70 - 99 mg/dL    Comment: Glucose reference range applies only to samples taken after fasting for at least 8 hours.    Blood Alcohol level:  Lab Results  Component Value Date   [facility] <15 05/10/2024    Metabolic Disorder Labs: Lab Results  Component Value Date   HGBA1C 5.7 (H) 05/13/2024   MPG 117 05/13/2024   No results found for: PROLACTIN Lab Results  Component Value Date   CHOL 178 05/13/2024   TRIG 37 05/13/2024   HDL 44 05/13/2024   CHOLHDL 4.0 05/13/2024   VLDL 7 05/13/2024   LDLCALC 127 (H) 05/13/2024   LDLCALC 75 05/23/2014    Physical Findings: AIMS:  ,  ,  ,  ,  ,  ,   CIWA:    COWS:     Musculoskeletal: Strength & Muscle Tone: within normal limits Gait & Station: normal Patient leans: N/A  Psychiatric  Specialty Exam:  Presentation  General Appearance and behavior:  Overweight, poor grooming, disorganized room, no EPS.  Eye Contact: Better.  Speech: Spontaneous.  Normalizing rate, tone and volume..  Mood and Affect  Mood: Subjectively and objectively better.  Affect:  Blunted and appropriate.  Thought Process  Thought Processes: Linear and goal directed.  Descriptions of Associations:Intact  Orientation:Full (Time, Place and Person)  Thought Content: Persecutory, paranoid delusion and delusions of being possessed by demons are less intense.  Less religious preoccupation.  No current suicidal thoughts.  No current homicidal thoughts.  No current thoughts of violence.  Hallucinations: No hallucinations today.  Sensorium  Memory: Did not assess at this time.  Judgment: Fair.  Insight: Better.  Executive Functions  Concentration: Less distractible.  Attention Span: Better.  Recall: Did not assess today.  Fund of Knowledge: Fair.  Language: Good   Psychomotor Activity  Psychomotor retardation.   Physical Exam: Physical Exam ROS Blood pressure 124/89, pulse (!) 108, temperature (!) 97.5 F (36.4 C), resp. rate 16, height 5' 5 (1.651 m), weight 109.6 kg, SpO2 100%. Body mass index is 40.2 kg/m.   Treatment Plan Summary: Patient seems to be responding to aripiprazole .  She is tolerating it without any adverse effects.  She has consented to long-acting injectable.  We will initiate and adjust her medicines as below.  1.  Aripiprazole  30 mg at bedtime for 10 days. 2.  Discontinue olanzapine  5 mg twice daily. 3.  Abilify  maintainer 400 mg tomorrow. 4.  Continue to encourage oral intake of food and fluids. 5.  Continue to encourage personal hygiene and grooming. 6..  Continue to monitor mood behavior and interaction with others. 7.  Social worker will coordinate discharge and aftercare planning.  Patricia DELENA Forehand, MD 05/16/2024, 2:53 PM

## 2024-05-16 NOTE — Group Note (Unsigned)
 Date:  05/16/2024 Time:  3:27 AM  Group Topic/Focus:  Wrap-Up Group:   The focus of this group is to help patients review their daily goal of treatment and discuss progress on daily workbooks.     Participation Level:  {BHH PARTICIPATION OZCZO:77735}  Participation Quality:  {BHH PARTICIPATION QUALITY:22265}  Affect:  {BHH AFFECT:22266}  Cognitive:  {BHH COGNITIVE:22267}  Insight: {BHH Insight2:20797}  Engagement in Group:  {BHH ENGAGEMENT IN HMNLE:77731}  Modes of Intervention:  {BHH MODES OF INTERVENTION:22269}  Additional Comments:  ***  [name] 05/16/2024, 3:27 AM

## 2024-05-16 NOTE — Group Note (Signed)
 Date:  05/16/2024 Time:  9:16 AM  Group Topic/Focus:  Goals Group:   The focus of this group is to help patients establish daily goals to achieve during treatment and discuss how the patient can incorporate goal setting into their daily lives to aide in recovery.    Participation Level:  Did Not Attend   Railynn Ballo A Landon Truax 05/16/2024, 9:16 AM

## 2024-05-16 NOTE — Progress Notes (Addendum)
(  Sleep Hours) -4.75 (Any PRNs that were needed, meds refused, or side effects to meds)- none (Any disturbances and when (visitation, over night)-none (Concerns raised by the patient)- none (SI/HI/AVH)-Pt endorses AH.pt stated her voices has improved. Pt remains hyper religous.

## 2024-05-16 NOTE — Group Note (Deleted)
 Date:  05/16/2024 Time:  2:22 PM  Group Topic/Focus:  Wellness Toolbox:   The focus of this group is to discuss various aspects of wellness, balancing those aspects and exploring ways to increase the ability to experience wellness.  Patients will create a wellness toolbox for use upon discharge.     Participation Level:  {B[facility] PARTICIPATION OZCZO:77735}  Participation Quality:  {BHH PARTICIPATION QUALITY:22265}  Affect:  {BHH AFFECT:22266}  Cognitive:  {B[facility] COGNITIVE:22267}  Insight: {BHH Insight2:20797}  Engagement in Group:  {BHH ENGAGEMENT IN HMNLE:77731}  Modes of Intervention:  {BHH MODES OF INTERVENTION:22269}  Additional Comments:  ***  Myra Curtistine BROCKS 05/16/2024, 2:22 PM

## 2024-05-17 DIAGNOSIS — F23 Brief psychotic disorder: Secondary | ICD-10-CM | POA: Diagnosis not present

## 2024-05-17 MED ORDER — OLANZAPINE 7.5 MG PO TABS
15.0000 mg | ORAL_TABLET | Freq: Every day | ORAL | Status: DC
  Administered 2024-05-17: 15 mg via ORAL
  Filled 2024-05-17: qty 2

## 2024-05-17 NOTE — Plan of Care (Signed)
   Problem: Education: Goal: Knowledge of [facility] Education information/materials will improve Outcome: Progressing Goal: Emotional status will improve Outcome: Progressing Goal: Mental status will improve Outcome: Progressing Goal: Verbalization of understanding the information provided will improve Outcome: Progressing

## 2024-05-17 NOTE — Progress Notes (Signed)
 [facility] MD Progress Note  05/17/2024 11:23 AM Patricia Branch  MRN:  [id number] Subjective:   18 year old female, lives with her family.  Background history of trauma, ADHD and anxiety disorder.  New onset psychosis in the past three months.  It has progressively gotten worse.  She hears voices of demons saying mean things to her.  She feels like she is possessed by the demons months.  Presented in company of her family for stabilization. Routine labs were essentially normal.  No alcohol or any psychoactive substance on board.  Chart reviewed today.  Patient discussed at multidisciplinary team meeting.  Nursing staff reports that patient slept for 1.25 hours.  She has been adherent with her medications.  She had a long-acting injectable this morning.  She was in group setting this morning.  Marginally less internally preoccupied.  Still expressing religious themes.  At interview with patient, she tells me that the voices are less intense.  States that she had them last night.  The devil threatening her and told her that God would not save her.  Patient states that she was scared to go to sleep last night.  She has been feeling better this morning.  States that the voices are less intense.  No current suicidal thoughts.  No current homicidal thoughts.  No current thoughts of violence.  No side effects from her medication.  States that her parents are coming to visit tonight as they could not make it yesterday. Have encouraged her to keep participating with unit groups and therapeutic activities.  Have encouraged her to keep ventilating her feelings to staff.    Principal Problem: Schizophreniform disorder (HCC) Diagnosis: Principal Problem:   Schizophreniform disorder (HCC) Active Problems:   Acute psychosis (HCC)  Total Time spent with patient: 30 minutes  Past Psychiatric History:  See H&P  Past Medical History: History reviewed. No pertinent past medical history. History reviewed. No pertinent  surgical history. Family History: History reviewed. No pertinent family history. Family Psychiatric  History:  Family history of schizophrenia on maternal side.  Social History:  Social History   Substance and Sexual Activity  Alcohol Use No     Social History   Substance and Sexual Activity  Drug Use Never    Social History   Socioeconomic History   Marital status: Single    Spouse name: Not on file   Number of children: Not on file   Years of education: Not on file   Highest education level: Not on file  Occupational History   Not on file  Tobacco Use   Smoking status: Never   Smokeless tobacco: Not on file  Vaping Use   Vaping status: Never Used  Substance and Sexual Activity   Alcohol use: No   Drug use: Never   Sexual activity: Not on file  Other Topics Concern   Not on file  Social History Narrative   Not on file   Social Drivers of Health   Financial Resource Strain: Not on file  Food Insecurity: No Food Insecurity (05/12/2024)   Hunger Vital Sign    Worried About Running Out of Food in the Last Year: Never true    Ran Out of Food in the Last Year: Never true  Transportation Needs: No Transportation Needs (05/12/2024)   PRAPARE - Administrator, Civil Service (Medical): No    Lack of Transportation (Non-Medical): No  Physical Activity: Not on file  Stress: Not on file  Social Connections: Not on file  Current Medications: Current Facility-Administered Medications  Medication Dose Route Frequency Provider Last Rate Last Admin   acetaminophen  (TYLENOL ) tablet 650 mg  650 mg Oral Q6H PRN Ajibola, Ene A, NP       alum & mag hydroxide-simeth (MAALOX/MYLANTA) 200-200-20 MG/5ML suspension 30 mL  30 mL Oral Q4H PRN Ajibola, Ene A, NP       ARIPiprazole  (ABILIFY ) tablet 30 mg  30 mg Oral QHS Aliza Moret A, MD   30 mg at 05/16/24 2100   ARIPiprazole  ER (ABILIFY  MAINTENA) 400 MG prefilled syringe 400 mg  400 mg Intramuscular Q28 days  Josiane Labine, Jerrell LABOR, MD   400 mg at 05/17/24 9065   haloperidol  (HALDOL ) tablet 5 mg  5 mg Oral TID PRN Ajibola, Ene A, NP       And   diphenhydrAMINE  (BENADRYL ) capsule 50 mg  50 mg Oral TID PRN Ajibola, Ene A, NP       haloperidol  lactate (HALDOL ) injection 5 mg  5 mg Intramuscular TID PRN Ajibola, Ene A, NP   5 mg at 05/15/24 0645   And   diphenhydrAMINE  (BENADRYL ) injection 50 mg  50 mg Intramuscular TID PRN Ajibola, Ene A, NP       And   LORazepam  (ATIVAN ) injection 2 mg  2 mg Intramuscular TID PRN Ajibola, Ene A, NP       haloperidol  lactate (HALDOL ) injection 10 mg  10 mg Intramuscular TID PRN Ajibola, Ene A, NP   10 mg at 05/13/24 0758   And   diphenhydrAMINE  (BENADRYL ) injection 50 mg  50 mg Intramuscular TID PRN Ajibola, Ene A, NP   50 mg at 05/15/24 9350   And   LORazepam  (ATIVAN ) injection 2 mg  2 mg Intramuscular TID PRN Ajibola, Ene A, NP   2 mg at 05/13/24 9241   hydrOXYzine  (ATARAX ) tablet 25 mg  25 mg Oral TID PRN Ajibola, Ene A, NP       magnesium  hydroxide (MILK OF MAGNESIA) suspension 30 mL  30 mL Oral Daily PRN Ajibola, Ene A, NP       OLANZapine  (ZYPREXA ) injection 5 mg  5 mg Intramuscular Q12H Zouev, Dmitri, MD   5 mg at 05/17/24 9141   OLANZapine  (ZYPREXA ) tablet 5 mg  5 mg Oral QHS Trudy Carwin, NP   5 mg at 05/16/24 2100   traZODone  (DESYREL ) tablet 50 mg  50 mg Oral QHS PRN Ajibola, Ene A, NP        Lab Results:  No results found for this or any previous visit (from the past 48 hours).   Blood Alcohol level:  Lab Results  Component Value Date   [facility] <15 05/10/2024    Metabolic Disorder Labs: Lab Results  Component Value Date   HGBA1C 5.7 (H) 05/13/2024   MPG 117 05/13/2024   No results found for: PROLACTIN Lab Results  Component Value Date   CHOL 178 05/13/2024   TRIG 37 05/13/2024   HDL 44 05/13/2024   CHOLHDL 4.0 05/13/2024   VLDL 7 05/13/2024   LDLCALC 127 (H) 05/13/2024   LDLCALC 75 05/23/2014    Physical Findings: AIMS:  ,  ,  ,  ,   ,  ,   CIWA:    COWS:     Musculoskeletal: Strength & Muscle Tone: within normal limits Gait & Station: normal Patient leans: N/A  Psychiatric Specialty Exam:  Presentation  General Appearance and behavior:  Overweight, poor grooming, in group setting today prior to interview.  Does  not appear internally preoccupied.  No EPS.  Eye Contact: Good.  Speech: Spontaneous.  Normal rate, tone and volume..  Mood and Affect  Mood: Subjectively and objectively better.  Affect: Restricted and appropriate.  Thought Process  Thought Processes: Normal speed of thought.  Linear and goal directed.  Descriptions of Associations:Intact  Orientation:Full (Time, Place and Person)  Thought Content: Persecutory, paranoid delusion and delusions of being possessed by demons are less intense.  Less religious preoccupation.  No current suicidal thoughts.  No current homicidal thoughts.  No current thoughts of violence.  Hallucinations: No hallucinations today.  Sensorium  Memory: Did not assess at this time.  Judgment: Fair.  Insight: Better.  Executive Functions  Concentration: Less distractible.  Attention Span: Better.  Recall: Did not assess today.  Fund of Knowledge: Fair.  Language: Good   Psychomotor Activity  Psychomotor retardation.   Physical Exam: Physical Exam ROS Blood pressure 124/89, pulse (!) 108, temperature (!) 97.5 F (36.4 C), resp. rate 16, height 5' 5 (1.651 m), weight 109.6 kg, SpO2 100%. Body mass index is 40.2 kg/m.   Treatment Plan Summary: Patient has tolerated medication adjustment well.  She slept poorly last night.  There is still residual hallucinations.  We will reinstate olanzapine  at 15 mg at bedtime.  Will maintain oral aripiprazole  for 10 days.  Patient not stable for lower level of care.  1.  Aripiprazole  30 mg at bedtime for 10 days. 2.  Olanzapine  15 mg at bedtime. 3.  Abilify  maintainer 400 mg monthly. 4.  Continue  to encourage oral intake of food and fluids. 5.  Continue to encourage personal hygiene and grooming. 6..  Continue to monitor mood behavior and interaction with others. 7.  Social worker will coordinate discharge and aftercare planning.  Jerrell DELENA Forehand, MD 05/17/2024, 11:23 AM

## 2024-05-17 NOTE — Plan of Care (Addendum)
 Pt A & O to self, place and event. Denies SI, HI and pain. Continues to endorse +AVH The demons are telling me not to say anything. I can't talk right now.  Observed with fixed smile, brief eye contact, disorganized, circumstantial speech on interactions; ambulatory with slow but steady gait. Pt remains isolative, guarded in her room. Showered with increased prompts by staff; pads given for menses. Pt ate 75% ob both lunch and dinner. Continued support and reassurance offered. Safety checks maintained at Q 15 minutes interval without outburst.   Problem: Health Behavior/Discharge Planning: Goal: Compliance with treatment plan for underlying cause of condition will improve Outcome: Progressing   Problem: Safety: Goal: Periods of time without injury will increase Outcome: Progressing   Problem: Activity: Goal: Interest or engagement in activities will improve Outcome: Not Progressing

## 2024-05-17 NOTE — Progress Notes (Signed)
(  Sleep Hours) - 2hrs (Any PRNs that were needed, meds refused, or side effects to meds)- none (Any disturbances and when (visitation, over night)- none (Concerns raised by the patient)- none (SI/HI/AVH)- denies

## 2024-05-17 NOTE — Group Note (Signed)
 Date:  05/17/2024 Time:  9:14 PM  Group Topic/Focus:  Wrap-Up Group:   The focus of this group is to help patients review their daily goal of treatment and discuss progress on daily workbooks.    Participation Level:  Active  Participation Quality:  Appropriate  Affect:  Flat  Cognitive:  Appropriate  Insight: Appropriate  Engagement in Group:  Engaged  Modes of Intervention:  Discussion  Additional Comments:   Pt states that she's had a good day because she was able to have a visit from her mother and has been eating well. Pt programs well but states she doesn't socialize much. Pt endorsed anxiety and depression at a 6 and states she hears voices sometimes.   Jeaninne Lodico A Satonya Lux 05/17/2024, 9:14 PM

## 2024-05-17 NOTE — [facility] Suicide Risk Assessment (Signed)
 [facility] INPATIENT:  Family/Significant Other Suicide Prevention Education  Suicide Prevention Education:  Education Completed; Henya Aguallo (mom) [phone],  (name of family member/significant other) has been identified by the patient as the family member/significant other with whom the patient will be residing, and identified as the person(s) who will aid the patient in the event of a mental health crisis (suicidal ideations/suicide attempt).  With written consent from the patient, the family member/significant other has been provided the following suicide prevention education, prior to the and/or following the discharge of the patient.  Mom said that patient lives with her.  Mom said they don't have guns or weapons, patient doesn't have access to guns or weapons.  Mom didn't have any safety concerns about patient being discharged.  The suicide prevention education provided includes the following: Suicide risk factors Suicide prevention and interventions National Suicide Hotline telephone number [facility] assessment telephone number [facility] Emergency Assistance 911 [facility] and/or Residential Mobile Crisis Unit telephone number  Request made of family/significant other to: Remove weapons (e.g., guns, rifles, knives), all items previously/currently identified as safety concern.   Remove drugs/medications (over-the-counter, prescriptions, illicit drugs), all items previously/currently identified as a safety concern.  The family member/significant other verbalizes understanding of the suicide prevention education information provided.  The family member/significant other agrees to remove the items of safety concern listed above.  Maila Dukes O Cordelia Bessinger, LCSWA 05/17/2024, 5:52 PM

## 2024-05-18 ENCOUNTER — Encounter (HOSPITAL_COMMUNITY): Payer: Self-pay

## 2024-05-18 DIAGNOSIS — F23 Brief psychotic disorder: Secondary | ICD-10-CM | POA: Diagnosis not present

## 2024-05-18 MED ORDER — OLANZAPINE 10 MG PO TABS
20.0000 mg | ORAL_TABLET | Freq: Every day | ORAL | Status: DC
  Administered 2024-05-18 – 2024-05-24 (×7): 20 mg via ORAL
  Filled 2024-05-18 (×6): qty 2
  Filled 2024-05-18: qty 28
  Filled 2024-05-18: qty 2

## 2024-05-18 MED ORDER — ARIPIPRAZOLE 15 MG PO TABS
30.0000 mg | ORAL_TABLET | Freq: Every day | ORAL | Status: DC
  Administered 2024-05-19 – 2024-05-25 (×7): 30 mg via ORAL
  Filled 2024-05-18 (×7): qty 2

## 2024-05-18 NOTE — Plan of Care (Addendum)
 Pt confirms poor sleep last night I stopped breathing couple times last night. I was paranoid because I felt like I was dying and going to [place]. I hear this one demon's voice constantly and he's constantly negative. Denies VH, SI, HI and pain when assessed. Remains medication compliant. Attended scheduled goals groups when prompted, but remains isolative to her room majority of this shift. Takes her medications without issues. Tolerates meals and fluids well (100% dinner & lunch). Safety checks maintained at Q 15 minutes intervals without outburst. Continued support, encouragement and reassurance offered.   Problem: Coping: Goal: Ability to demonstrate self-control will improve Outcome: Progressing   Problem: Safety: Goal: Periods of time without injury will increase Outcome: Progressing   Problem: Activity: Goal: Sleeping patterns will improve Outcome: Not Progressing

## 2024-05-18 NOTE — Progress Notes (Signed)
 0125 Pt came to the Nsg Station saying; :I am fainting, I am going to pass out. I may have a seizure, but I have never had a seizure. Pt re-directed to her room. 0130 Pt came back saying; I'm going to faint. Pt sat down on the floor for 20 minutes. Pt then got up and was advised to go change into some clean clothes. 0210 Pt then got up from the floor and went to her room perform personal hygiene and sat on her bed. No noted distress. Monitoring continues during 7p-7a shift.

## 2024-05-18 NOTE — [facility] Group Notes (Signed)
 Psychoeducational Group Note  Date:  05/18/2024 Time:  2043  Group Topic/Focus:  Wrap-Up Group:   The focus of this group is to help patients review their daily goal of treatment and discuss progress on daily workbooks.  Participation Level: Did Not Attend  Participation Quality:  Not Applicable  Affect:  Not Applicable  Cognitive:  Not Applicable  Insight:  Not Applicable  Engagement in Group: Not Applicable  Additional Comments:  The patient did not attend group this evening.   Amylynn Fano S 05/18/2024, 8:43 PM

## 2024-05-18 NOTE — BH IP Treatment Plan (Signed)
 Interdisciplinary Treatment and Diagnostic Plan Update  05/18/2024 Time of Session: 12:25 PM - UPDATE Patricia Branch MRN: [id number]  Principal Diagnosis: Schizophreniform disorder [facility])  Secondary Diagnoses: Principal Problem:   Schizophreniform disorder (HCC) Active Problems:   Acute psychosis (HCC)   Current Medications:  Current Facility-Administered Medications  Medication Dose Route Frequency Provider Last Rate Last Admin   acetaminophen  (TYLENOL ) tablet 650 mg  650 mg Oral Q6H PRN Ajibola, Ene A, NP       alum & mag hydroxide-simeth (MAALOX/MYLANTA) 200-200-20 MG/5ML suspension 30 mL  30 mL Oral Q4H PRN Ajibola, Ene A, NP       [START ON 05/19/2024] ARIPiprazole  (ABILIFY ) tablet 30 mg  30 mg Oral Daily Izediuno, Jerrell LABOR, MD       ARIPiprazole  ER (ABILIFY  MAINTENA) 400 MG prefilled syringe 400 mg  400 mg Intramuscular Q28 days Izediuno, Jerrell LABOR, MD   400 mg at 05/17/24 0934   haloperidol  (HALDOL ) tablet 5 mg  5 mg Oral TID PRN Ajibola, Ene A, NP       And   diphenhydrAMINE  (BENADRYL ) capsule 50 mg  50 mg Oral TID PRN Ajibola, Ene A, NP       haloperidol  lactate (HALDOL ) injection 5 mg  5 mg Intramuscular TID PRN Ajibola, Ene A, NP   5 mg at 05/15/24 9354   And   diphenhydrAMINE  (BENADRYL ) injection 50 mg  50 mg Intramuscular TID PRN Ajibola, Ene A, NP       And   LORazepam  (ATIVAN ) injection 2 mg  2 mg Intramuscular TID PRN Ajibola, Ene A, NP       haloperidol  lactate (HALDOL ) injection 10 mg  10 mg Intramuscular TID PRN Ajibola, Ene A, NP   10 mg at 05/13/24 0758   And   diphenhydrAMINE  (BENADRYL ) injection 50 mg  50 mg Intramuscular TID PRN Ajibola, Ene A, NP   50 mg at 05/15/24 9350   And   LORazepam  (ATIVAN ) injection 2 mg  2 mg Intramuscular TID PRN Ajibola, Ene A, NP   2 mg at 05/13/24 0758   hydrOXYzine  (ATARAX ) tablet 25 mg  25 mg Oral TID PRN Ajibola, Ene A, NP       magnesium  hydroxide (MILK OF MAGNESIA) suspension 30 mL  30 mL Oral Daily PRN Ajibola, Ene A, NP        OLANZapine  (ZYPREXA ) tablet 20 mg  20 mg Oral QHS Izediuno, Vincent A, MD       traZODone  (DESYREL ) tablet 50 mg  50 mg Oral QHS PRN Ajibola, Ene A, NP       PTA Medications: No medications prior to admission.    Patient Stressors: Other: hallucinations, delusions    Patient Strengths: Average or above average intelligence  Supportive family/friends   Treatment Modalities: Medication Management, Group therapy, Case management,  1 to 1 session with clinician, Psychoeducation, Recreational therapy.   Physician Treatment Plan for Primary Diagnosis: Schizophreniform disorder (HCC) Long Term Goal(s): Improvement in symptoms so as ready for discharge   Short Term Goals: Ability to identify changes in lifestyle to reduce recurrence of condition will improve Ability to verbalize feelings will improve Ability to disclose and discuss suicidal ideas Ability to demonstrate self-control will improve Ability to identify and develop effective coping behaviors will improve Ability to maintain clinical measurements within normal limits will improve Compliance with prescribed medications will improve Ability to identify triggers associated with substance abuse/mental health issues will improve  Medication Management: Evaluate patient's response, side effects, and tolerance  of medication regimen.  Therapeutic Interventions: 1 to 1 sessions, Unit Group sessions and Medication administration.  Evaluation of Outcomes: Progressing  Physician Treatment Plan for Secondary Diagnosis: Principal Problem:   Schizophreniform disorder (HCC) Active Problems:   Acute psychosis (HCC)  Long Term Goal(s): Improvement in symptoms so as ready for discharge   Short Term Goals: Ability to identify changes in lifestyle to reduce recurrence of condition will improve Ability to verbalize feelings will improve Ability to disclose and discuss suicidal ideas Ability to demonstrate self-control will improve Ability  to identify and develop effective coping behaviors will improve Ability to maintain clinical measurements within normal limits will improve Compliance with prescribed medications will improve Ability to identify triggers associated with substance abuse/mental health issues will improve     Medication Management: Evaluate patient's response, side effects, and tolerance of medication regimen.  Therapeutic Interventions: 1 to 1 sessions, Unit Group sessions and Medication administration.  Evaluation of Outcomes: Progressing   RN Treatment Plan for Primary Diagnosis: Schizophreniform disorder (HCC) Long Term Goal(s): Knowledge of disease and therapeutic regimen to maintain health will improve  Short Term Goals: Ability to remain free from injury will improve, Ability to verbalize frustration and anger appropriately will improve, Ability to verbalize feelings will improve, and Ability to disclose and discuss suicidal ideas  Medication Management: RN will administer medications as ordered by provider, will assess and evaluate patient's response and provide education to patient for prescribed medication. RN will report any adverse and/or side effects to prescribing provider.  Therapeutic Interventions: 1 on 1 counseling sessions, Psychoeducation, Medication administration, Evaluate responses to treatment, Monitor vital signs and CBGs as ordered, Perform/monitor CIWA, COWS, AIMS and Fall Risk screenings as ordered, Perform wound care treatments as ordered.  Evaluation of Outcomes: Progressing   LCSW Treatment Plan for Primary Diagnosis: Schizophreniform disorder [facility]) Long Term Goal(s): Safe transition to appropriate next level of care at discharge, Engage patient in therapeutic group addressing interpersonal concerns.  Short Term Goals: Engage patient in aftercare planning with referrals and resources, Increase ability to appropriately verbalize feelings, Facilitate acceptance of mental health  diagnosis and concerns, and Identify triggers associated with mental health/substance abuse issues  Therapeutic Interventions: Assess for all discharge needs, 1 to 1 time with Social worker, Explore available resources and support systems, Assess for adequacy in community support network, Educate family and significant other(s) on suicide prevention, Complete Psychosocial Assessment, Interpersonal group therapy.  Evaluation of Outcomes: Progressing   Progress in Treatment: Attending groups: attended some groups Participating in groups: Yes Taking medication as prescribed:patient hasn't been prescribed any medications yet Toleration medication: patient hasn't been prescribed any medications yet Family/Significant other contact made: Yes, contacted Darian Ace (mom) [phone]  Patient understands diagnosis: Yes. Discussing patient identified problems/goals with staff: Yes. Medical problems stabilized or resolved: Yes. Denies suicidal/homicidal ideation: Yes. Issues/concerns per patient self-inventory: No.   Patient Goals:  Not really anything I want to work on   Discharge Plan or Barriers: Pt plans to return home after discharged.   Reason for Continuation of Hospitalization: Hallucinations Medication stabilization   Estimated Length of Stay: 4 - 6 days  Last 3 [place] Suicide Severity Risk Score: Flowsheet Row Ad[facility] (Current) [facility] from 05/10/2024 in [facility]  C-SSRS RISK CATEGORY High Risk High Risk    Last [facility] 2/9 Scores:     No data to display          Scribe for Treatment  Team: Rakim Moone O Juergen Hardenbrook, LCSWA 05/18/2024 7:03 PM

## 2024-05-18 NOTE — Plan of Care (Signed)
   Problem: Education: Goal: Knowledge of [place] [facility] Education information/materials will improve Outcome: Progressing Goal: Emotional status will improve Outcome: Progressing Goal: Mental status will improve Outcome: Progressing

## 2024-05-18 NOTE — [facility] Group Notes (Signed)
 Adult Psychoeducational Group Note  Date:  05/18/2024 Time:  5:39 PM  Group Topic/Focus:  Goals Group:   The focus of this group is to help patients establish daily goals to achieve during treatment and discuss how the patient can incorporate goal setting into their daily lives to aide in recovery. Orientation:   The focus of this group is to educate the patient on the purpose and policies of crisis stabilization and provide a format to answer questions about their admission.  The group details unit policies and expectations of patients while admitted.  Participation Level:  Active  Participation Quality:  Appropriate  Affect:  Appropriate  Cognitive:  Appropriate  Insight: Appropriate  Engagement in Group:  Engaged  Modes of Intervention:  Discussion  Additional Comments:  Pt attended the goals group and remained appropriate and engaged throughout the duration of the group.   Patricia Branch 05/18/2024, 5:39 PM

## 2024-05-18 NOTE — Progress Notes (Signed)
 [facility] MD Progress Note  05/18/2024 11:46 AM Patricia Branch  MRN:  [id number] Subjective:   18 year old female, lives with her family.  Background history of trauma, ADHD and anxiety disorder.  New onset psychosis in the past three months.  It has progressively gotten worse.  She hears voices of demons saying mean things to her.  She feels like she is possessed by the demons months.  Presented in company of her family for stabilization. Routine labs were essentially normal.  No alcohol or any psychoactive substance on board.  Chart reviewed today.  Patient discussed at multidisciplinary team meeting.  Nursing staff reports that patient has been adherent to her regimen.  No PRNs required.  She slept for 1.25 hours.  She is still not grooming self.  Remains religiously preoccupied.  Continues to experience auditory hallucination.  No behavioral issues.  Patient's mother came to visit her yesterday.  Seen today.  Patient states that the voices are getting quieter.  States that she had them last night.  Acknowledged not sleeping that much.  She is not endorsing any voices at this time.  Patient is not endorsing any side effects from her regimen.  No suicidal thoughts.  No rageful thoughts towards others or to property. Encouraged to give the medication time to fully kick in.   Principal Problem: Schizophreniform disorder (HCC) Diagnosis: Principal Problem:   Schizophreniform disorder (HCC) Active Problems:   Acute psychosis (HCC)  Total Time spent with patient: 30 minutes  Past Psychiatric History:  See H&P  Past Medical History: History reviewed. No pertinent past medical history. History reviewed. No pertinent surgical history. Family History: History reviewed. No pertinent family history. Family Psychiatric  History:  Family history of schizophrenia on maternal side.  Social History:  Social History   Substance and Sexual Activity  Alcohol Use No     Social History   Substance and  Sexual Activity  Drug Use Never    Social History   Socioeconomic History   Marital status: Single    Spouse name: Not on file   Number of children: Not on file   Years of education: Not on file   Highest education level: Not on file  Occupational History   Not on file  Tobacco Use   Smoking status: Never   Smokeless tobacco: Not on file  Vaping Use   Vaping status: Never Used  Substance and Sexual Activity   Alcohol use: No   Drug use: Never   Sexual activity: Not on file  Other Topics Concern   Not on file  Social History Narrative   Not on file   Social Drivers of Health   Financial Resource Strain: Not on file  Food Insecurity: No Food Insecurity (05/12/2024)   Hunger Vital Sign    Worried About Running Out of Food in the Last Year: Never true    Ran Out of Food in the Last Year: Never true  Transportation Needs: No Transportation Needs (05/12/2024)   PRAPARE - Administrator, Civil Service (Medical): No    Lack of Transportation (Non-Medical): No  Physical Activity: Not on file  Stress: Not on file  Social Connections: Not on file     Current Medications: Current Facility-Administered Medications  Medication Dose Route Frequency Provider Last Rate Last Admin   acetaminophen  (TYLENOL ) tablet 650 mg  650 mg Oral Q6H PRN Ajibola, Ene A, NP       alum & mag hydroxide-simeth (MAALOX/MYLANTA) 200-200-20 MG/5ML suspension 30 mL  30 mL Oral Q4H PRN Ajibola, Ene A, NP       ARIPiprazole  (ABILIFY ) tablet 30 mg  30 mg Oral QHS Neely Cecena, Jerrell LABOR, MD   30 mg at 05/17/24 2105   ARIPiprazole  ER (ABILIFY  MAINTENA) 400 MG prefilled syringe 400 mg  400 mg Intramuscular Q28 days Kylieann Eagles, Jerrell LABOR, MD   400 mg at 05/17/24 0934   haloperidol  (HALDOL ) tablet 5 mg  5 mg Oral TID PRN Ajibola, Ene A, NP       And   diphenhydrAMINE  (BENADRYL ) capsule 50 mg  50 mg Oral TID PRN Ajibola, Ene A, NP       haloperidol  lactate (HALDOL ) injection 5 mg  5 mg Intramuscular TID PRN  Ajibola, Ene A, NP   5 mg at 05/15/24 0645   And   diphenhydrAMINE  (BENADRYL ) injection 50 mg  50 mg Intramuscular TID PRN Ajibola, Ene A, NP       And   LORazepam  (ATIVAN ) injection 2 mg  2 mg Intramuscular TID PRN Ajibola, Ene A, NP       haloperidol  lactate (HALDOL ) injection 10 mg  10 mg Intramuscular TID PRN Ajibola, Ene A, NP   10 mg at 05/13/24 0758   And   diphenhydrAMINE  (BENADRYL ) injection 50 mg  50 mg Intramuscular TID PRN Ajibola, Ene A, NP   50 mg at 05/15/24 9350   And   LORazepam  (ATIVAN ) injection 2 mg  2 mg Intramuscular TID PRN Ajibola, Ene A, NP   2 mg at 05/13/24 9241   hydrOXYzine  (ATARAX ) tablet 25 mg  25 mg Oral TID PRN Ajibola, Ene A, NP       magnesium  hydroxide (MILK OF MAGNESIA) suspension 30 mL  30 mL Oral Daily PRN Ajibola, Ene A, NP       OLANZapine  (ZYPREXA ) tablet 15 mg  15 mg Oral QHS Avyn Aden A, MD   15 mg at 05/17/24 2105   traZODone  (DESYREL ) tablet 50 mg  50 mg Oral QHS PRN Ajibola, Ene A, NP        Lab Results:  No results found for this or any previous visit (from the past 48 hours).   Blood Alcohol level:  Lab Results  Component Value Date   [facility] <15 05/10/2024    Metabolic Disorder Labs: Lab Results  Component Value Date   HGBA1C 5.7 (H) 05/13/2024   MPG 117 05/13/2024   No results found for: PROLACTIN Lab Results  Component Value Date   CHOL 178 05/13/2024   TRIG 37 05/13/2024   HDL 44 05/13/2024   CHOLHDL 4.0 05/13/2024   VLDL 7 05/13/2024   LDLCALC 127 (H) 05/13/2024   LDLCALC 75 05/23/2014    Physical Findings: AIMS:  ,  ,  ,  ,  ,  ,   CIWA:    COWS:     Musculoskeletal: Strength & Muscle Tone: within normal limits Gait & Station: normal Patient leans: N/A  Psychiatric Specialty Exam:  Presentation  General Appearance and behavior:  Overweight, poor grooming, not in any distress, giggling to self.  No EPS.  Eye Contact: Good.  Speech: Spontaneous.  Normal rate, tone and volume..  Mood and Affect   Mood: Subjectively and objectively better.  Affect: Restricted and appropriate.  Thought Process  Thought Processes: Normal speed of thought.  Linear and goal directed.  Descriptions of Associations:Intact  Orientation:Full (Time, Place and Person)  Thought Content: Persecutory, paranoid delusion and delusions of being possessed by demons are less intense.  Less religious preoccupation.  No current suicidal thoughts.  No current homicidal thoughts.  No current thoughts of violence.  Hallucinations: No hallucinations today.  Sensorium  Memory: Did not assess at this time.  Judgment: Fair.  Insight: Better.  Executive Functions  Concentration: Less distractible.  Attention Span: Better.  Recall: Did not assess today.  Fund of Knowledge: Fair.  Language: Good   Psychomotor Activity  Psychomotor retardation.   Physical Exam: Physical Exam ROS Blood pressure 124/89, pulse (!) 108, temperature (!) 97.5 F (36.4 C), resp. rate 16, height 5' 5 (1.651 m), weight 109.6 kg, SpO2 100%. Body mass index is 40.2 kg/m.   Treatment Plan Summary: Patient has tolerated recent adjustments made.  She is still internally preoccupied.  She is still tormented by the demons.  Intensity not as much as before.  We will adjust olanzapine  again today.  We will continue to evaluate her.  1.  Aripiprazole  30 mg at bedtime for 10 days. 2.  Olanzapine  20 mg at bedtime. 3.  Abilify  maintainer 400 mg monthly. 4.  Continue to encourage oral intake of food and fluids. 5.  Continue to encourage personal hygiene and grooming. 6..  Continue to monitor mood behavior and interaction with others. 7.  Social worker will coordinate discharge and aftercare planning.  Jerrell DELENA Forehand, MD 05/18/2024, 11:46 AM

## 2024-05-18 NOTE — [facility] Group Notes (Signed)

## 2024-05-19 DIAGNOSIS — F29 Unspecified psychosis not due to a substance or known physiological condition: Secondary | ICD-10-CM | POA: Diagnosis not present

## 2024-05-19 DIAGNOSIS — F2081 Schizophreniform disorder: Secondary | ICD-10-CM | POA: Diagnosis not present

## 2024-05-19 NOTE — Progress Notes (Signed)
 [facility] MD Progress Note  05/19/2024 12:03 PM Nilda Keathley  MRN:  [id number] Subjective:   18 year old female, lives with her family.  Background history of trauma, ADHD and anxiety disorder.  New onset psychosis in the past three months.  It has progressively gotten worse.  She hears voices of demons saying mean things to her.  She feels like she is possessed by the demons months.  Presented in company of her family for stabilization. Routine labs were essentially normal.  No alcohol or any psychoactive substance on board.  Chart reviewed today.  Patient discussed at multidisciplinary team meeting.  Staff reports that patient slept for 6 hours.  She has been adherent with her medications.  No PRNs required.  She is still internally preoccupied and mumbles to self.  She continues to endorse hallucinations.  Seen today.  Patient tells me that she feels better today as she is more relaxed.  States that she still hears God's voice telling her that the devil is trying to get her.  Patient reports hearing the devil threatening to kill her and threatening her that she is not going to be saved.  Patient states that the voices are however not as bad as they were before.  No side effects from medication.  Patient is not endorsing any thoughts of violence.  She is focused on going home soon.  Encouraged to keep ventilating her feelings to staff.  Principal Problem: Schizophreniform disorder (HCC) Diagnosis: Principal Problem:   Schizophreniform disorder (HCC) Active Problems:   Acute psychosis (HCC)  Total Time spent with patient: 30 minutes  Past Psychiatric History:  See H&P  Past Medical History: History reviewed. No pertinent past medical history. History reviewed. No pertinent surgical history. Family History: History reviewed. No pertinent family history. Family Psychiatric  History:  Family history of schizophrenia on maternal side.  Social History:  Social History   Substance and Sexual  Activity  Alcohol Use No     Social History   Substance and Sexual Activity  Drug Use Never    Social History   Socioeconomic History   Marital status: Single    Spouse name: Not on file   Number of children: Not on file   Years of education: Not on file   Highest education level: Not on file  Occupational History   Not on file  Tobacco Use   Smoking status: Never   Smokeless tobacco: Not on file  Vaping Use   Vaping status: Never Used  Substance and Sexual Activity   Alcohol use: No   Drug use: Never   Sexual activity: Not on file  Other Topics Concern   Not on file  Social History Narrative   Not on file   Social Drivers of Health   Financial Resource Strain: Not on file  Food Insecurity: No Food Insecurity (05/12/2024)   Hunger Vital Sign    Worried About Running Out of Food in the Last Year: Never true    Ran Out of Food in the Last Year: Never true  Transportation Needs: No Transportation Needs (05/12/2024)   PRAPARE - Administrator, Civil Service (Medical): No    Lack of Transportation (Non-Medical): No  Physical Activity: Not on file  Stress: Not on file  Social Connections: Not on file     Current Medications: Current Facility-Administered Medications  Medication Dose Route Frequency Provider Last Rate Last Admin   acetaminophen  (TYLENOL ) tablet 650 mg  650 mg Oral Q6H PRN Ajibola, Ene  A, NP       alum & mag hydroxide-simeth (MAALOX/MYLANTA) 200-200-20 MG/5ML suspension 30 mL  30 mL Oral Q4H PRN Ajibola, Ene A, NP       ARIPiprazole  (ABILIFY ) tablet 30 mg  30 mg Oral Daily Naiara Lombardozzi A, MD   30 mg at 05/19/24 9166   ARIPiprazole  ER (ABILIFY  MAINTENA) 400 MG prefilled syringe 400 mg  400 mg Intramuscular Q28 days Bradely Rudin, Jerrell LABOR, MD   400 mg at 05/17/24 9065   haloperidol  (HALDOL ) tablet 5 mg  5 mg Oral TID PRN Ajibola, Ene A, NP       And   diphenhydrAMINE  (BENADRYL ) capsule 50 mg  50 mg Oral TID PRN Ajibola, Ene A, NP        haloperidol  lactate (HALDOL ) injection 5 mg  5 mg Intramuscular TID PRN Ajibola, Ene A, NP   5 mg at 05/15/24 0645   And   diphenhydrAMINE  (BENADRYL ) injection 50 mg  50 mg Intramuscular TID PRN Ajibola, Ene A, NP       And   LORazepam  (ATIVAN ) injection 2 mg  2 mg Intramuscular TID PRN Ajibola, Ene A, NP       haloperidol  lactate (HALDOL ) injection 10 mg  10 mg Intramuscular TID PRN Ajibola, Ene A, NP   10 mg at 05/13/24 0758   And   diphenhydrAMINE  (BENADRYL ) injection 50 mg  50 mg Intramuscular TID PRN Ajibola, Ene A, NP   50 mg at 05/15/24 9350   And   LORazepam  (ATIVAN ) injection 2 mg  2 mg Intramuscular TID PRN Ajibola, Ene A, NP   2 mg at 05/13/24 0758   hydrOXYzine  (ATARAX ) tablet 25 mg  25 mg Oral TID PRN Ajibola, Ene A, NP   25 mg at 05/18/24 2024   magnesium  hydroxide (MILK OF MAGNESIA) suspension 30 mL  30 mL Oral Daily PRN Ajibola, Ene A, NP       OLANZapine  (ZYPREXA ) tablet 20 mg  20 mg Oral QHS Katrine Radich A, MD   20 mg at 05/18/24 2024   traZODone  (DESYREL ) tablet 50 mg  50 mg Oral QHS PRN Ajibola, Ene A, NP   50 mg at 05/18/24 2024    Lab Results:  No results found for this or any previous visit (from the past 48 hours).   Blood Alcohol level:  Lab Results  Component Value Date   [facility] <15 05/10/2024    Metabolic Disorder Labs: Lab Results  Component Value Date   HGBA1C 5.7 (H) 05/13/2024   MPG 117 05/13/2024   No results found for: PROLACTIN Lab Results  Component Value Date   CHOL 178 05/13/2024   TRIG 37 05/13/2024   HDL 44 05/13/2024   CHOLHDL 4.0 05/13/2024   VLDL 7 05/13/2024   LDLCALC 127 (H) 05/13/2024   LDLCALC 75 05/23/2014    Physical Findings: AIMS:  ,  ,  ,  ,  ,  ,   CIWA:    COWS:     Musculoskeletal: Strength & Muscle Tone: within normal limits Gait & Station: normal Patient leans: N/A  Psychiatric Specialty Exam:  Presentation  General Appearance and behavior:  Overweight, poor grooming, not in any distress, less  perplexed.  No EPS.  Eye Contact: Good.  Speech: Spontaneous.  Normal rate, tone and volume..  Mood and Affect  Mood: Subjectively and objectively better.  Affect: Restricted and appropriate.  Thought Process  Thought Processes: Normal speed of thought.  Linear and goal directed.  Descriptions  of Associations:Intact  Orientation:Full (Time, Place and Person)  Thought Content: Persecutory, paranoid delusion and delusions of being possessed by demons are less intense.  Less religious preoccupation.  No current suicidal thoughts.  No current homicidal thoughts.  No current thoughts of violence.  Hallucinations: No hallucinations today.  Sensorium  Memory: Did not assess at this time.  Judgment: Fair.  Insight: Better.  Executive Functions  Concentration: Less distractible.  Attention Span: Better.  Recall: Did not assess today.  Fund of Knowledge: Fair.  Language: Good   Psychomotor Activity  Psychomotor retardation.   Physical Exam: Physical Exam ROS Blood pressure 124/89, pulse (!) 108, temperature (!) 97.5 F (36.4 C), resp. rate 16, height 5' 5 (1.651 m), weight 109.6 kg, SpO2 100%. Body mass index is 40.2 kg/m.   Treatment Plan Summary: Patient slept better with aripiprazole  during the day.  She is still internally preoccupied but not as intense as she was yesterday.  She is not endorsing any ritual thoughts towards herself or towards anyone else.  We adjusted her medicines yesterday.  Will keep with the same and evaluate her further.  1.  Aripiprazole  30 mg at bedtime for 8 days. 2.  Olanzapine  20 mg at bedtime. 3.  Abilify  maintainer 400 mg monthly. 4.  Continue to encourage oral intake of food and fluids. 5.  Continue to encourage personal hygiene and grooming. 6..  Continue to monitor mood behavior and interaction with others. 7.  Social worker will coordinate discharge and aftercare planning.  Jerrell DELENA Forehand, MD 05/19/2024,  12:03 PM

## 2024-05-19 NOTE — Plan of Care (Signed)
   Problem: Education: Goal: Emotional status will improve Outcome: Progressing Goal: Mental status will improve Outcome: Progressing Goal: Verbalization of understanding the information provided will improve Outcome: Progressing

## 2024-05-19 NOTE — Group Note (Signed)
 Date:  05/19/2024 Time:  12:54 PM  Group Topic/Focus:  Goals Group:   The focus of this group is to help patients establish daily goals to achieve during treatment and discuss how the patient can incorporate goal setting into their daily lives to aide in recovery.    Participation Level:  Active  Participation Quality:  Appropriate  Affect:  Appropriate  Cognitive:  Appropriate  Insight: Appropriate  Engagement in Group:  Engaged  Modes of Intervention:  Discussion  Additional Comments:  Pt spoke on her goals of being happy and sociable   Nat Rummer 05/19/2024, 12:54 PM

## 2024-05-19 NOTE — Group Note (Signed)
 Date:  05/19/2024 Time:  8:28 PM  Group Topic/Focus:   Wrap-Up Group:   The focus of this group is to help patients review their daily goal of treatment and discuss progress on daily workbooks.  Patient attended Group and Participated.  Participation Level:  Minimal  Participation Quality:  Appropriate  Affect:  Flat  Cognitive:  Appropriate  Insight: Good  Engagement in Group:  Poor  Modes of Intervention:  Socialization  Additional Comments:    Patricia Branch 05/19/2024, 8:28 PM

## 2024-05-19 NOTE — Group Note (Signed)
 LCSW Group Therapy Note  Group Date: 05/19/2024 Start Time: 1000 End Time: 1045   Type of Therapy and Topic:  Group Therapy - Healthy vs Unhealthy Coping Skills  Participation Level:  None   Description of Group The focus of this group was to determine what unhealthy coping techniques typically are used by group members and what healthy coping techniques would be helpful in coping with various problems. Patients were guided in becoming aware of the differences between healthy and unhealthy coping techniques. Patients were asked to identify 2-3 healthy coping skills they would like to learn to use more effectively.  Therapeutic Goals Patients learned that coping is what human beings do all day long to deal with various situations in their lives Patients defined and discussed healthy vs unhealthy coping techniques Patients identified their preferred coping techniques and identified whether these were healthy or unhealthy Patients determined 2-3 healthy coping skills they would like to become more familiar with and use more often. Patients provided support and ideas to each other   Summary of Patient Progress:  During group, the patient came in late expressed that she does not have anything to share. Patient proved open to input from peers and feedback from CSW. Patient demonstrated no insight into the subject matter, was respectful of peers, and participated throughout the entire session.   Therapeutic Modalities Cognitive Behavioral Therapy Motivational Interviewing  Itay Mella O Jp Eastham, LCSWA 05/19/2024  11:06 AM

## 2024-05-19 NOTE — Progress Notes (Signed)
   05/18/24 2237  Psych Admission Type (Psych Patients Only)  Admission Status Involuntary  Psychosocial Assessment  Patient Complaints Anxiety;Worrying  Eye Contact Brief  Facial Expression Anxious  Affect Preoccupied  Speech Soft  Interaction Isolative  Motor Activity Slow  Appearance/Hygiene In scrubs  Behavior Characteristics Cooperative  Mood Suspicious;Preoccupied;Anxious  Thought Process  Coherency Disorganized;Circumstantial  Content Blaming others  Delusions Paranoid  Perception Derealization  Hallucination Auditory  Judgment Impaired  Confusion Mild  Danger to Self  Current suicidal ideation? Denies  Agreement Not to Harm Self Yes  Description of Agreement Verbal  Danger to Others  Danger to Others None reported or observed

## 2024-05-19 NOTE — Progress Notes (Signed)
(  Sleep Hours) - 6 (Any PRNs that were needed, meds refused, or side effects to meds)- PRN vistaril  25 mg and trazodone  50 mg given at pt request, no meds refused.  (Any disturbances and when (visitation, over night)- None  (Concerns raised by the patient)- None  (SI/HI/AVH)- Denies SI/HI. Continues to experience auditory hallucinations and paranoid delusions.

## 2024-05-20 DIAGNOSIS — F2081 Schizophreniform disorder: Secondary | ICD-10-CM | POA: Diagnosis not present

## 2024-05-20 DIAGNOSIS — F29 Unspecified psychosis not due to a substance or known physiological condition: Secondary | ICD-10-CM | POA: Diagnosis not present

## 2024-05-20 NOTE — Progress Notes (Addendum)
 D: Patient is alert, isolative, guarded, and cooperative. Patient reports the voices are quieter now. Denies SI, HI, VH, and verbally contracts for safety.    A: Scheduled medications administered per MD order. Support provided. Patient educated on safety on the unit and medications. Routine safety checks every 15 minutes. Patient stated understanding to tell nurse about any new physical symptoms. Patient understands to tell staff of any needs.     R: No adverse drug reactions noted. Patient remains safe at this time and will continue to monitor.    05/20/24 0800  Psych Admission Type (Psych Patients Only)  Admission Status Involuntary  Psychosocial Assessment  Patient Complaints None  Eye Contact Brief  Facial Expression Flat  Affect Preoccupied  Speech Soft  Interaction Isolative;Minimal  Motor Activity Slow  Appearance/Hygiene In scrubs;Poor hygiene  Behavior Characteristics Cooperative  Mood Anxious;Preoccupied  Thought Process  Coherency Disorganized  Content Preoccupation  Delusions Paranoid  Perception Hallucinations  Hallucination Auditory  Judgment Impaired  Confusion None  Danger to Self  Current suicidal ideation? Denies  Agreement Not to Harm Self Yes  Description of Agreement verbal  Danger to Others  Danger to Others None reported or observed

## 2024-05-20 NOTE — Plan of Care (Signed)
  Problem: Education: Goal: Knowledge of [facility] Education information/materials will improve Outcome: Progressing Goal: Emotional status will improve Outcome: Progressing Goal: Mental status will improve Outcome: Progressing Goal: Verbalization of understanding the information provided will improve Outcome: Progressing   Problem: Coping: Goal: Ability to demonstrate self-control will improve Outcome: Progressing   Problem: Safety: Goal: Periods of time without injury will increase Outcome: Progressing   Problem: Activity: Goal: Interest or engagement in activities will improve Outcome: Not Progressing

## 2024-05-20 NOTE — Progress Notes (Signed)
   05/19/24 2323  Psych Admission Type (Psych Patients Only)  Admission Status Involuntary  Psychosocial Assessment  Patient Complaints Anxiety  Eye Contact Brief  Facial Expression Flat  Affect Preoccupied  Speech Soft  Interaction Isolative  Motor Activity Slow  Appearance/Hygiene In scrubs;Poor hygiene  Behavior Characteristics Cooperative  Mood Anxious;Preoccupied  Thought Process  Coherency Disorganized  Content Preoccupation  Delusions Paranoid  Perception Hallucinations  Hallucination Auditory  Judgment Impaired  Confusion None  Danger to Self  Current suicidal ideation? Denies  Agreement Not to Harm Self Yes  Description of Agreement Verbal  Danger to Others  Danger to Others None reported or observed

## 2024-05-20 NOTE — Progress Notes (Signed)
 [facility] MD Progress Note  05/20/2024 2:56 PM Patricia Branch  MRN:  [id number] Subjective:   18 year old female, lives with her family.  Background history of trauma, ADHD and anxiety disorder.  New onset psychosis in the past three months.  It has progressively gotten worse.  She hears voices of demons saying mean things to her.  She feels like she is possessed by the demons months.  Presented in company of her family for stabilization. Routine labs were essentially normal.  No alcohol or any psychoactive substance on board.  Chart reviewed today.  Patient discussed at multidisciplinary team meeting.  Staff reports that patient has been adherent with her medications.  She slept for 7.5 hours.  She is reporting less hallucinations.  She is still not grooming herself appropriately.  She is eating her meals.  She is drinking enough fluids.  Patient states that she feels better today.  States that the voices are quieter.  She is able to cope with when they do occur.  States eye does not bother like before.  Patient is focused on going home soon.  States that her mother is coming to visit today.  Patient did not endorse any delusions today.  She is not endorsing any violent thoughts towards self or towards others.  No side effects from her medicine.  Encouraged to keep ventilating her feelings to staff.   Principal Problem: Schizophreniform disorder (HCC) Diagnosis: Principal Problem:   Schizophreniform disorder (HCC) Active Problems:   Acute psychosis (HCC)  Total Time spent with patient: 30 minutes  Past Psychiatric History:  See H&P  Past Medical History: History reviewed. No pertinent past medical history. History reviewed. No pertinent surgical history. Family History: History reviewed. No pertinent family history. Family Psychiatric  History:  Family history of schizophrenia on maternal side.  Social History:  Social History   Substance and Sexual Activity  Alcohol Use No     Social  History   Substance and Sexual Activity  Drug Use Never    Social History   Socioeconomic History   Marital status: Single    Spouse name: Not on file   Number of children: Not on file   Years of education: Not on file   Highest education level: Not on file  Occupational History   Not on file  Tobacco Use   Smoking status: Never   Smokeless tobacco: Not on file  Vaping Use   Vaping status: Never Used  Substance and Sexual Activity   Alcohol use: No   Drug use: Never   Sexual activity: Not on file  Other Topics Concern   Not on file  Social History Narrative   Not on file   Social Drivers of Health   Financial Resource Strain: Not on file  Food Insecurity: No Food Insecurity (05/12/2024)   Hunger Vital Sign    Worried About Running Out of Food in the Last Year: Never true    Ran Out of Food in the Last Year: Never true  Transportation Needs: No Transportation Needs (05/12/2024)   PRAPARE - Administrator, Civil Service (Medical): No    Lack of Transportation (Non-Medical): No  Physical Activity: Not on file  Stress: Not on file  Social Connections: Not on file     Current Medications: Current Facility-Administered Medications  Medication Dose Route Frequency Provider Last Rate Last Admin   acetaminophen  (TYLENOL ) tablet 650 mg  650 mg Oral Q6H PRN Ajibola, Ene A, NP  alum & mag hydroxide-simeth (MAALOX/MYLANTA) 200-200-20 MG/5ML suspension 30 mL  30 mL Oral Q4H PRN Ajibola, Ene A, NP       ARIPiprazole  (ABILIFY ) tablet 30 mg  30 mg Oral Daily Abad Manard, Jerrell LABOR, MD   30 mg at 05/20/24 0755   ARIPiprazole  ER (ABILIFY  MAINTENA) 400 MG prefilled syringe 400 mg  400 mg Intramuscular Q28 days Shaunna Rosetti, Jerrell LABOR, MD   400 mg at 05/17/24 0934   haloperidol  (HALDOL ) tablet 5 mg  5 mg Oral TID PRN Ajibola, Ene A, NP       And   diphenhydrAMINE  (BENADRYL ) capsule 50 mg  50 mg Oral TID PRN Ajibola, Ene A, NP       haloperidol  lactate (HALDOL ) injection 5 mg   5 mg Intramuscular TID PRN Ajibola, Ene A, NP   5 mg at 05/15/24 0645   And   diphenhydrAMINE  (BENADRYL ) injection 50 mg  50 mg Intramuscular TID PRN Ajibola, Ene A, NP       And   LORazepam  (ATIVAN ) injection 2 mg  2 mg Intramuscular TID PRN Ajibola, Ene A, NP       haloperidol  lactate (HALDOL ) injection 10 mg  10 mg Intramuscular TID PRN Ajibola, Ene A, NP   10 mg at 05/13/24 0758   And   diphenhydrAMINE  (BENADRYL ) injection 50 mg  50 mg Intramuscular TID PRN Ajibola, Ene A, NP   50 mg at 05/15/24 9350   And   LORazepam  (ATIVAN ) injection 2 mg  2 mg Intramuscular TID PRN Ajibola, Ene A, NP   2 mg at 05/13/24 0758   hydrOXYzine  (ATARAX ) tablet 25 mg  25 mg Oral TID PRN Ajibola, Ene A, NP   25 mg at 05/19/24 2028   magnesium  hydroxide (MILK OF MAGNESIA) suspension 30 mL  30 mL Oral Daily PRN Ajibola, Ene A, NP       OLANZapine  (ZYPREXA ) tablet 20 mg  20 mg Oral QHS Kattleya Kuhnert A, MD   20 mg at 05/19/24 2028   traZODone  (DESYREL ) tablet 50 mg  50 mg Oral QHS PRN Ajibola, Ene A, NP   50 mg at 05/19/24 2028    Lab Results:  No results found for this or any previous visit (from the past 48 hours).   Blood Alcohol level:  Lab Results  Component Value Date   [facility] <15 05/10/2024    Metabolic Disorder Labs: Lab Results  Component Value Date   HGBA1C 5.7 (H) 05/13/2024   MPG 117 05/13/2024   No results found for: PROLACTIN Lab Results  Component Value Date   CHOL 178 05/13/2024   TRIG 37 05/13/2024   HDL 44 05/13/2024   CHOLHDL 4.0 05/13/2024   VLDL 7 05/13/2024   LDLCALC 127 (H) 05/13/2024   LDLCALC 75 05/23/2014    Physical Findings: AIMS:  ,  ,  ,  ,  ,  ,   CIWA:    COWS:     Musculoskeletal: Strength & Muscle Tone: within normal limits Gait & Station: normal Patient leans: N/A  Psychiatric Specialty Exam:  Presentation  General Appearance and behavior:  Overweight, poor grooming, not in any distress, less perplexed.  No EPS.  Eye  Contact: Good.  Speech: Spontaneous.  Normal rate, tone and volume..  Mood and Affect  Mood: Subjectively and objectively better.  Affect: Restricted and appropriate.  Thought Process  Thought Processes: Normal speed of thought.  Linear and goal directed.  Descriptions of Associations:Intact  Orientation:Full (Time, Place and Person)  Thought Content: Persecutory, paranoid delusion and delusions of being possessed by demons are less intense.  Less religious preoccupation.  No current suicidal thoughts.  No current homicidal thoughts.  No current thoughts of violence.  Hallucinations: No hallucinations today.  Sensorium  Memory: Did not assess at this time.  Judgment: Better.  Insight: Better.  Executive Functions  Concentration: Less distractible.  Attention Span: Better.  Recall: Did not assess today.  Fund of Knowledge: Fair.  Language: Good   Psychomotor Activity  Psychomotor retardation.   Physical Exam: Physical Exam ROS Blood pressure 124/89, pulse (!) 108, temperature (!) 97.5 F (36.4 C), resp. rate 16, height 5' 5 (1.651 m), weight 109.6 kg, SpO2 100%. Body mass index is 40.2 kg/m.   Treatment Plan Summary: Patient has been able to sleep well for the past 2 nights.  She is feeling better.  Hallucinations are less intense.  She is less invested in her delusions.  She is not endorsing any violent thoughts towards herself or others.  She had her long-acting injectable aripiprazole  and has been taking p.o. form.  Patient's mother will visit tonight and hopefully give us  feedback.  Anticipated discharge by Tuesday.  Will maintain her current regimen.  1.  Aripiprazole  30 mg at bedtime for 7 days. 2.  Olanzapine  20 mg at bedtime. 3.  Abilify  maintainer 400 mg monthly. 4.  Continue to encourage oral intake of food and fluids. 5.  Continue to encourage personal hygiene and grooming. 6..  Continue to monitor mood behavior and interaction with  others. 7.  Social worker will coordinate discharge and aftercare planning.  Jerrell DELENA Forehand, MD 05/20/2024, 2:56 PM

## 2024-05-20 NOTE — Plan of Care (Signed)
   Problem: Education: Goal: Emotional status will improve Outcome: Progressing Goal: Mental status will improve Outcome: Progressing Goal: Verbalization of understanding the information provided will improve Outcome: Progressing

## 2024-05-20 NOTE — Group Note (Signed)
 Date:  05/20/2024 Time:  8:39 PM  Group Topic/Focus:  Wrap-Up Group:   The focus of this group is to help patients review their daily goal of treatment and discuss progress on daily workbooks.    Participation Level:  Did Not Attend  Participation Quality:  none  Affect:  none  Cognitive:  none  Insight: None  Engagement in Group:  none  Modes of Intervention:  none  Additional Comments:   Pt was encouraged but refused to attend wrap up  Patricia Branch A Patricia Branch 05/20/2024, 8:39 PM

## 2024-05-20 NOTE — Progress Notes (Signed)
(  Sleep Hours) - 7.5 (Any PRNs that were needed, meds refused, or side effects to meds)- PRN trazodone  50 mg and vistaril  25 mg given, no meds refused.  (Any disturbances and when (visitation, over night)- None  (Concerns raised by the patient)- None  (SI/HI/AVH)- Denies SI/HI. Pt continues to experience auditory hallucinations but is improving. Still remains isolative to room and does not attend group.

## 2024-05-21 DIAGNOSIS — F2081 Schizophreniform disorder: Secondary | ICD-10-CM | POA: Diagnosis not present

## 2024-05-21 LAB — VITAMIN D 25 HYDROXY (VIT D DEFICIENCY, FRACTURES): Vit D, 25-Hydroxy: 21.42 ng/mL — ABNORMAL LOW (ref 30–100)

## 2024-05-21 MED ORDER — ESCITALOPRAM OXALATE 10 MG PO TABS
10.0000 mg | ORAL_TABLET | Freq: Every day | ORAL | Status: DC
  Administered 2024-05-21 – 2024-05-24 (×4): 10 mg via ORAL
  Filled 2024-05-21 (×2): qty 1
  Filled 2024-05-21: qty 14
  Filled 2024-05-21 (×2): qty 1

## 2024-05-21 NOTE — Plan of Care (Signed)
   Problem: Education: Goal: Emotional status will improve Outcome: Progressing Goal: Mental status will improve Outcome: Progressing

## 2024-05-21 NOTE — Progress Notes (Signed)
   05/21/24 2100  Charting Type  Charting Type Shift assessment  Safety Check Verification  Has the RN verified the 15 minute safety check completion? Yes  Neurological  Neuro (WDL) X  HEENT  HEENT (WDL) WDL  Respiratory  Respiratory (WDL) WDL  Cardiac  Cardiac (WDL) WDL  Vascular  Vascular (WDL) WDL  Integumentary  Integumentary (WDL) X  Braden Scale (Ages 8 and up)  Sensory Perceptions 4  Moisture 4  Activity 4  Mobility 4  Nutrition 4  Friction and Shear 3  Braden Scale Score 23  Musculoskeletal  Musculoskeletal (WDL) WDL  Gastrointestinal  Gastrointestinal (WDL) WDL  Last BM Date  05/21/24  GU Assessment  Genitourinary (WDL) WDL  Neurological  Level of Consciousness Alert

## 2024-05-21 NOTE — Group Note (Signed)
 Date:  05/21/2024 Time:  8:38 PM  Group Topic/Focus:  Wrap-Up Group:   The focus of this group is to help patients review their daily goal of treatment and discuss progress on daily workbooks.    Participation Level:  None  Participation Quality:  none  Affect:  none  Cognitive:  none  Insight: None  Engagement in Group:  None  Modes of Intervention:  none  Additional Comments:   Pt did attend wrap up group but refused to participate  Janiqua Friscia A Rayshawn Visconti 05/21/2024, 8:38 PM

## 2024-05-21 NOTE — Plan of Care (Signed)

## 2024-05-21 NOTE — Progress Notes (Signed)
 [facility] MD Progress Note  05/21/2024 2:13 PM Patricia Branch  MRN:  [id number]  Subjective:   18 year old female, lives with her family.  Background history of trauma, ADHD and anxiety disorder.  New onset psychosis in the past three months.  It has progressively gotten worse.  She hears voices of demons saying mean things to her.  She feels like she is possessed by the demons months.  Presented in company of her family for stabilization. Routine labs were essentially normal.  No alcohol or any psychoactive substance on board.  Chart reviewed today.  Overnight report discussed with nursing.  No acute events occurred.  Patient was seen in her room during rounds.  She continues to endorse paranoid delusions of demonic possession leading to her hospitalization.  She continues to report paranoia which she describes as high anxiety.  She reports that paranoia is pervasive and longstanding and also accompanied by significant anxiety.  She states that she has never tried SSRI antidepressants to better manage anxiety.  She also reports subjectively depressed mood which is longstanding.  We discussed adding Lexapro  to her regimen to address depression and anxiety.  She continues to report auditory hallucinations, but states that the hallucinations come and go now.  We discussed obtaining additional labs to check vitamin levels.   Principal Problem: Schizophreniform disorder (HCC) Diagnosis: Principal Problem:   Schizophreniform disorder (HCC) Active Problems:   Acute psychosis (HCC)  Total Time spent with patient: 30 minutes  Past Psychiatric History:  See H&P  Past Medical History: History reviewed. No pertinent past medical history. History reviewed. No pertinent surgical history. Family History: History reviewed. No pertinent family history. Family Psychiatric  History:  Family history of schizophrenia on maternal side.  Social History:  Social History   Substance and Sexual Activity   Alcohol Use No     Social History   Substance and Sexual Activity  Drug Use Never    Social History   Socioeconomic History   Marital status: Single    Spouse name: Not on file   Number of children: Not on file   Years of education: Not on file   Highest education level: Not on file  Occupational History   Not on file  Tobacco Use   Smoking status: Never   Smokeless tobacco: Not on file  Vaping Use   Vaping status: Never Used  Substance and Sexual Activity   Alcohol use: No   Drug use: Never   Sexual activity: Not on file  Other Topics Concern   Not on file  Social History Narrative   Not on file   Social Drivers of Health   Financial Resource Strain: Not on file  Food Insecurity: No Food Insecurity (05/12/2024)   Hunger Vital Sign    Worried About Running Out of Food in the Last Year: Never true    Ran Out of Food in the Last Year: Never true  Transportation Needs: No Transportation Needs (05/12/2024)   PRAPARE - Administrator, Civil Service (Medical): No    Lack of Transportation (Non-Medical): No  Physical Activity: Not on file  Stress: Not on file  Social Connections: Not on file     Current Medications: Current Facility-Administered Medications  Medication Dose Route Frequency Provider Last Rate Last Admin   acetaminophen  (TYLENOL ) tablet 650 mg  650 mg Oral Q6H PRN Ajibola, Ene A, NP       alum & mag hydroxide-simeth (MAALOX/MYLANTA) 200-200-20 MG/5ML suspension 30 mL  30 mL  Oral Q4H PRN Ajibola, Ene A, NP       ARIPiprazole  (ABILIFY ) tablet 30 mg  30 mg Oral Daily Izediuno, Jerrell LABOR, MD   30 mg at 05/21/24 0855   ARIPiprazole  ER (ABILIFY  MAINTENA) 400 MG prefilled syringe 400 mg  400 mg Intramuscular Q28 days Izediuno, Jerrell LABOR, MD   400 mg at 05/17/24 9065   haloperidol  (HALDOL ) tablet 5 mg  5 mg Oral TID PRN Ajibola, Ene A, NP       And   diphenhydrAMINE  (BENADRYL ) capsule 50 mg  50 mg Oral TID PRN Ajibola, Ene A, NP       haloperidol   lactate (HALDOL ) injection 5 mg  5 mg Intramuscular TID PRN Ajibola, Ene A, NP   5 mg at 05/15/24 0645   And   diphenhydrAMINE  (BENADRYL ) injection 50 mg  50 mg Intramuscular TID PRN Ajibola, Ene A, NP       And   LORazepam  (ATIVAN ) injection 2 mg  2 mg Intramuscular TID PRN Ajibola, Ene A, NP       haloperidol  lactate (HALDOL ) injection 10 mg  10 mg Intramuscular TID PRN Ajibola, Ene A, NP   10 mg at 05/13/24 0758   And   diphenhydrAMINE  (BENADRYL ) injection 50 mg  50 mg Intramuscular TID PRN Ajibola, Ene A, NP   50 mg at 05/15/24 9350   And   LORazepam  (ATIVAN ) injection 2 mg  2 mg Intramuscular TID PRN Ajibola, Ene A, NP   2 mg at 05/13/24 0758   hydrOXYzine  (ATARAX ) tablet 25 mg  25 mg Oral TID PRN Ajibola, Ene A, NP   25 mg at 05/19/24 2028   magnesium  hydroxide (MILK OF MAGNESIA) suspension 30 mL  30 mL Oral Daily PRN Ajibola, Ene A, NP       OLANZapine  (ZYPREXA ) tablet 20 mg  20 mg Oral QHS Izediuno, Vincent A, MD   20 mg at 05/20/24 2050   traZODone  (DESYREL ) tablet 50 mg  50 mg Oral QHS PRN Ajibola, Ene A, NP   50 mg at 05/19/24 2028    Lab Results:  No results found for this or any previous visit (from the past 48 hours).   Blood Alcohol level:  Lab Results  Component Value Date   [facility] <15 05/10/2024    Metabolic Disorder Labs: Lab Results  Component Value Date   HGBA1C 5.7 (H) 05/13/2024   MPG 117 05/13/2024   No results found for: PROLACTIN Lab Results  Component Value Date   CHOL 178 05/13/2024   TRIG 37 05/13/2024   HDL 44 05/13/2024   CHOLHDL 4.0 05/13/2024   VLDL 7 05/13/2024   LDLCALC 127 (H) 05/13/2024   LDLCALC 75 05/23/2014    Physical Findings: AIMS:  ,  ,  ,  ,  ,  ,   CIWA:    COWS:     Musculoskeletal: Strength & Muscle Tone: within normal limits Gait & Station: normal Patient leans: N/A  Psychiatric Specialty Exam:  Presentation  General Appearance and behavior:  Overweight, poor grooming, not in any distress, less perplexed.  No  EPS.  Eye Contact: Good.  Speech: Spontaneous.  Normal rate, tone and volume..  Mood and Affect  Mood: Depressed and anxious  Affect: Restricted and appropriate.  Thought Process  Thought Processes: Normal speed of thought.  Linear and goal directed.  Descriptions of Associations:Intact  Orientation:Full (Time, Place and Person)  Thought Content: Persecutory, paranoid delusion and delusions of being possessed by demons are  less intense.  Less religious preoccupation.  No current suicidal thoughts.  No current homicidal thoughts.  No current thoughts of violence.  Hallucinations: Intermittent auditory hallucinations  Sensorium  Memory: Attends to the conversation without difficulty  Judgment: Fair  Insight: Poor, believes she suffered demonic possession  Executive Functions  Concentration: Less distractible.  Attention Span: Fair  Recall: Did not assess today.  Fund of Knowledge: Fair.  Language: Good   Psychomotor Activity  No tics, tremors, or mannerisms   Physical Exam: Physical Exam ROS Blood pressure 122/66, pulse 97, temperature (!) 97.5 F (36.4 C), resp. rate 16, height 5' 5 (1.651 m), weight 109.6 kg, SpO2 100%. Body mass index is 40.2 kg/m.   Treatment Plan Summary: Patient has been able to sleep well for the past 2 nights.  She is feeling better.  Hallucinations are less intense.  She is less invested in her delusions.  She is not endorsing any violent thoughts towards herself or others.  She had her long-acting injectable aripiprazole  and has been taking p.o. form.  Patient's mother will visit tonight and hopefully give us  feedback.  Anticipated discharge by Friday.   1.  Aripiprazole  30 mg at bedtime for 7 days. 2.  Olanzapine  20 mg at bedtime. 3.  Abilify  maintainer 400 mg monthly. 4.  Continue to encourage oral intake of food and fluids. 5.  Continue to encourage personal hygiene and grooming. 6..  Continue to monitor mood  behavior and interaction with others. 7.  Social worker will coordinate discharge and aftercare planning. 8.  Start Lexapro  10 mg nightly for mood and anxiety symptoms.  Starleen GORMAN Kitty, MD 05/21/2024, 2:13 PM

## 2024-05-21 NOTE — BH Assessment (Signed)
(  Sleep Hours) - 5.5 (Any PRNs that were needed, meds refused, or side effects to meds)- None (Any disturbances and when (visitation, over night)- None (Concerns raised by the patient)- None (SI/HI/AVH)- Denies

## 2024-05-21 NOTE — Progress Notes (Signed)
   05/20/24 2005  Psych Admission Type (Psych Patients Only)  Admission Status Involuntary  Psychosocial Assessment  Patient Complaints None  Eye Contact Fair  Facial Expression Flat  Affect Preoccupied  Speech Soft  Interaction Minimal;Guarded;Isolative  Motor Activity Other (Comment) (WNL)  Appearance/Hygiene Disheveled;Poor hygiene;In scrubs  Behavior Characteristics Appropriate to situation;Calm  Mood Preoccupied  Thought Process  Coherency Disorganized  Content Preoccupation  Delusions Paranoid  Perception WDL  Hallucination None reported or observed  Judgment Poor  Confusion None  Danger to Self  Current suicidal ideation?  (Denies)  Agreement Not to Harm Self Yes  Description of Agreement Notify Staff  Danger to Others  Danger to Others None reported or observed

## 2024-05-21 NOTE — Group Note (Signed)
 LCSW Group Therapy Note   Group Date: 05/21/2024 Start Time: 1300 End Time: 1400   Participation:  patient was present and actively participated in the discussion.  Type of Therapy:  Group Therapy  Title:  Speaking from the Heart: Communicating with Understanding and Empathy  Objective:  To help participants develop effective communication skills to express themselves clearly, listen actively, and navigate conflicts in a healthy way.  Goals: Increase awareness of verbal and non-verbal communication skills. Practice using "I" statements and active listening techniques. Learn coping strategies for managing communication stress.  Summary:  Participants explored the importance of communication, discussed challenges, and practiced skills such as active listening and assertive expression. They reflected on past experiences and identified ways to improve communication in their daily lives.  Therapeutic Modalities: Cognitive-Behavioral Therapy (CBT): Restructuring negative thought patterns in communication. Mindfulness: Staying present and calm during conversations. Psychoeducation: Learning about effective communication techniques.   Sharion Grieves O Daven Pinckney, LCSWA 05/21/2024  5:59 PM

## 2024-05-22 DIAGNOSIS — F23 Brief psychotic disorder: Secondary | ICD-10-CM | POA: Diagnosis not present

## 2024-05-22 MED ORDER — VITAMIN D (ERGOCALCIFEROL) 1.25 MG (50000 UNIT) PO CAPS
50000.0000 [IU] | ORAL_CAPSULE | Freq: Two times a day (BID) | ORAL | Status: DC
  Administered 2024-05-22 – 2024-05-25 (×7): 50000 [IU] via ORAL
  Filled 2024-05-22 (×6): qty 1

## 2024-05-22 MED ORDER — TRAZODONE HCL 50 MG PO TABS
50.0000 mg | ORAL_TABLET | Freq: Every day | ORAL | Status: DC
  Administered 2024-05-22 – 2024-05-24 (×3): 50 mg via ORAL
  Filled 2024-05-22 (×2): qty 1
  Filled 2024-05-22: qty 14
  Filled 2024-05-22: qty 1
  Filled 2024-05-22: qty 14

## 2024-05-22 NOTE — Progress Notes (Signed)
(  Sleep Hours) -3.5 (Any PRNs that were needed, meds refused, or side effects to meds)- Trazodone  (Any disturbances and when (visitation, over night)-none (Concerns raised by the patient)- none (SI/HI/AVH)- stated the voices and shadows were improving

## 2024-05-22 NOTE — Progress Notes (Signed)
   05/21/24 2100  Psych Admission Type (Psych Patients Only)  Admission Status Involuntary  Psychosocial Assessment  Patient Complaints None  Eye Contact Fair  Facial Expression Flat  Affect Preoccupied  Speech Soft  Interaction Minimal  Motor Activity Slow  Appearance/Hygiene In scrubs  Behavior Characteristics Appropriate to situation  Mood Suspicious  Aggressive Behavior  Effect No apparent injury  Thought Process  Coherency Disorganized  Content Preoccupation  Delusions Paranoid  Perception Hallucinations  Hallucination Auditory  Judgment Impaired  Confusion None  Danger to Self  Current suicidal ideation? Denies

## 2024-05-22 NOTE — Plan of Care (Signed)
  Problem: Education: Goal: Emotional status will improve Outcome: Progressing   Problem: Activity: Goal: Interest or engagement in activities will improve Outcome: Progressing   Problem: Health Behavior/Discharge Planning: Goal: Compliance with treatment plan for underlying cause of condition will improve Outcome: Progressing

## 2024-05-22 NOTE — Plan of Care (Signed)
   Problem: Education: Goal: Knowledge of [place] [facility] Education information/materials will improve Outcome: Progressing Goal: Emotional status will improve Outcome: Progressing Goal: Mental status will improve Outcome: Progressing

## 2024-05-22 NOTE — B[facility] Group Notes (Signed)
 Adult Psychoeducational Group Note  Date:  05/22/2024 Time:  8:43 PM  Group Topic/Focus:  Wrap-Up Group:   The focus of this group is to help patients review their daily goal of treatment and discuss progress on daily workbooks.  Participation Level:  Active  Participation Quality:  Appropriate  Affect:  Appropriate  Cognitive:  Appropriate  Insight: Appropriate  Engagement in Group:  Engaged  Modes of Intervention:  Discussion  Additional Comments:  Pt attended group.  Drue Pouch 05/22/2024, 8:43 PM

## 2024-05-22 NOTE — Progress Notes (Signed)
  Patricia Branch   Type of Note: IVC COURT HEARING  Pt and this Clinical research associate were present for virtual IVC court hearing. Corlis granted [facility] additional 7 days.  Signed:  Catheryn Slifer, LCSW-A 05/22/2024  2:16 PM

## 2024-05-22 NOTE — Progress Notes (Signed)
 [facility] MD Progress Note  05/22/2024 1:52 PM Patricia Branch  MRN:  [id number]  Reason for admission::   18 year old female, lives with her family.  Background history of trauma, ADHD and anxiety disorder.  New onset psychosis in the past three months.  It has progressively gotten worse.  She hears voices of demons saying mean things to her.  She feels like she is possessed by the demons months.  Presented in company of her family for stabilization. Routine labs were essentially normal.  No alcohol or any psychoactive substance on board.  Chart reviewed today.  Overnight report discussed with nursing. No acute events occurred.  Reports good appetite.   Today's assessment notes: Patient presents alert and oriented to person, place, time, and partially situation. She was seen in her room during rounds.  She continues to endorse paranoid delusions of demonic possession specifically of hearing voices telling her to kill her mom, leading to her hospitalization.  She continues to report paranoia which she endorses as high anxiety.  She reports that paranoia is pervasive and chronic and also accompanied by significant anxiety.  Patient was started on SSRI (Lexapro ) for her depression/anxiety states.  Today patient reports anxiety of #7/10, and depression of #10/10, with 10 being most severe.  Will continue to monitor patient therapeutic response to SSRI.  She continues to report auditory hallucinations, but states that the hallucinations come and go now.  She denies SI, HI, or VH.  Reports sleep hours of 3.5, trazodone  changed from as needed to scheduled nightly.  Vitamin D  25-hydroxy resulted with levels of 21.42, patient was started on vitamin D  50,000 units po BID x 7 days.  Principal Problem: Schizophreniform disorder (HCC) Diagnosis: Principal Problem:   Schizophreniform disorder (HCC) Active Problems:   Acute psychosis (HCC)  Total Time spent with patient: 45 minutes  Past Psychiatric History:  See  H&P  Past Medical History: History reviewed. No pertinent past medical history. History reviewed. No pertinent surgical history. Family History: History reviewed. No pertinent family history. Family Psychiatric  History:  Family history of schizophrenia on maternal side.  Social History:  Social History   Substance and Sexual Activity  Alcohol Use No     Social History   Substance and Sexual Activity  Drug Use Never    Social History   Socioeconomic History   Marital status: Single    Spouse name: Not on file   Number of children: Not on file   Years of education: Not on file   Highest education level: Not on file  Occupational History   Not on file  Tobacco Use   Smoking status: Never   Smokeless tobacco: Not on file  Vaping Use   Vaping status: Never Used  Substance and Sexual Activity   Alcohol use: No   Drug use: Never   Sexual activity: Not on file  Other Topics Concern   Not on file  Social History Narrative   Not on file   Social Drivers of Health   Financial Resource Strain: Not on file  Food Insecurity: No Food Insecurity (05/12/2024)   Hunger Vital Sign    Worried About Running Out of Food in the Last Year: Never true    Ran Out of Food in the Last Year: Never true  Transportation Needs: No Transportation Needs (05/12/2024)   PRAPARE - Administrator, Civil Service (Medical): No    Lack of Transportation (Non-Medical): No  Physical Activity: Not on file  Stress: Not  on file  Social Connections: Not on file   Current Medications: Current Facility-Administered Medications  Medication Dose Route Frequency Provider Last Rate Last Admin   acetaminophen  (TYLENOL ) tablet 650 mg  650 mg Oral Q6H PRN Ajibola, Ene A, NP       alum & mag hydroxide-simeth (MAALOX/MYLANTA) 200-200-20 MG/5ML suspension 30 mL  30 mL Oral Q4H PRN Ajibola, Ene A, NP       ARIPiprazole  (ABILIFY ) tablet 30 mg  30 mg Oral Daily Izediuno, Vincent A, MD   30 mg at 05/22/24  0846   ARIPiprazole  ER (ABILIFY  MAINTENA) 400 MG prefilled syringe 400 mg  400 mg Intramuscular Q28 days Izediuno, Jerrell LABOR, MD   400 mg at 05/17/24 9065   haloperidol  (HALDOL ) tablet 5 mg  5 mg Oral TID PRN Ajibola, Ene A, NP       And   diphenhydrAMINE  (BENADRYL ) capsule 50 mg  50 mg Oral TID PRN Ajibola, Ene A, NP       haloperidol  lactate (HALDOL ) injection 5 mg  5 mg Intramuscular TID PRN Ajibola, Ene A, NP   5 mg at 05/15/24 0645   And   diphenhydrAMINE  (BENADRYL ) injection 50 mg  50 mg Intramuscular TID PRN Ajibola, Ene A, NP       And   LORazepam  (ATIVAN ) injection 2 mg  2 mg Intramuscular TID PRN Ajibola, Ene A, NP       haloperidol  lactate (HALDOL ) injection 10 mg  10 mg Intramuscular TID PRN Ajibola, Ene A, NP   10 mg at 05/13/24 0758   And   diphenhydrAMINE  (BENADRYL ) injection 50 mg  50 mg Intramuscular TID PRN Ajibola, Ene A, NP   50 mg at 05/15/24 9350   And   LORazepam  (ATIVAN ) injection 2 mg  2 mg Intramuscular TID PRN Ajibola, Ene A, NP   2 mg at 05/13/24 0758   escitalopram  (LEXAPRO ) tablet 10 mg  10 mg Oral QHS Parker, Alvin S, MD   10 mg at 05/21/24 2045   hydrOXYzine  (ATARAX ) tablet 25 mg  25 mg Oral TID PRN Ajibola, Ene A, NP   25 mg at 05/19/24 2028   magnesium  hydroxide (MILK OF MAGNESIA) suspension 30 mL  30 mL Oral Daily PRN Ajibola, Ene A, NP       OLANZapine  (ZYPREXA ) tablet 20 mg  20 mg Oral QHS Izediuno, Vincent A, MD   20 mg at 05/21/24 2046   traZODone  (DESYREL ) tablet 50 mg  50 mg Oral QHS PRN Ajibola, Ene A, NP   50 mg at 05/21/24 2046   Vitamin D  (Ergocalciferol ) (DRISDOL ) 1.25 MG (50000 UNIT) capsule 50,000 Units  50,000 Units Oral BID Parker, Alvin S, MD   50,000 Units at 05/22/24 1246   Lab Results:  Results for orders placed or performed during the hospital encounter of 05/12/24 (from the past 48 hours)  VITAMIN D  25 Hydroxy (Vit-D Deficiency, Fractures)     Status: Abnormal   Collection Time: 05/21/24  6:35 PM  Result Value Ref Range   Vit D,  25-Hydroxy 21.42 (L) 30 - 100 ng/mL    Comment: (NOTE) Vitamin D  deficiency has been defined by the Institute of Medicine  and an Endocrine Society practice guideline as a level of serum 25-OH  vitamin D  less than 20 ng/mL (1,2). The Endocrine Society went on to  further define vitamin D  insufficiency as a level between 21 and 29  ng/mL (2).  1. IOM (Institute of Medicine). 2010. Dietary reference intakes for  calcium and D. [place]: [employer]. 2. Holick MF, Binkley [place], Bischoff-Ferrari HA, et al. Evaluation,  treatment, and prevention of vitamin D  deficiency: an Endocrine  Society clinical practice guideline, JCEM. 2011 Jul; 96(7): 1911-30.  Performed at [facility], [address], [place], [place] [address]    Blood Alcohol level:  Lab Results  Component Value Date   [facility] <15 05/10/2024   Metabolic Disorder Labs: Lab Results  Component Value Date   HGBA1C 5.7 (H) 05/13/2024   MPG 117 05/13/2024   No results found for: PROLACTIN Lab Results  Component Value Date   CHOL 178 05/13/2024   TRIG 37 05/13/2024   HDL 44 05/13/2024   CHOLHDL 4.0 05/13/2024   VLDL 7 05/13/2024   LDLCALC 127 (H) 05/13/2024   LDLCALC 75 05/23/2014   Physical Findings: AIMS:  ,  ,  ,  ,  ,  ,   CIWA:    COWS:     Musculoskeletal: Strength & Muscle Tone: within normal limits Gait & Station: normal Patient leans: N/A  Psychiatric Specialty Exam:  Presentation  General Appearance and behavior:  Overweight, poor grooming, not in any distress, less perplexed.  No EPS.  Eye Contact: Good.  Speech: Spontaneous.  Normal rate, tone and volume..  Mood and Affect  Mood: Depressed and anxious  Affect: Restricted and appropriate.  Thought Process  Thought Processes: Normal speed of thought.  Linear and goal directed.  Descriptions of Associations:Intact  Orientation:Full (Time, Place and Person)  Thought Content: Persecutory, paranoid delusion  and delusions of being possessed by demons are less intense.  Less religious preoccupation.  No current suicidal thoughts.  No current homicidal thoughts.  No current thoughts of violence.  Hallucinations: Intermittent auditory hallucinations  Sensorium  Memory: Attends to the conversation without difficulty  Judgment: Fair  Insight: Poor, believes she suffered demonic possession  Executive Functions  Concentration: Less distractible.  Attention Span: Fair  Recall: Did not assess today.  Fund of Knowledge: Fair.  Language: Good  Psychomotor Activity  No tics, tremors, or mannerisms  Physical Exam: Physical Exam Vitals and nursing note reviewed.  Constitutional:      General: She is not in acute distress.    Appearance: She is not ill-appearing.  HENT:     Head: Normocephalic.     Right Ear: External ear normal.     Left Ear: External ear normal.     Mouth/Throat:     Mouth: Mucous membranes are moist.     Pharynx: Oropharynx is clear.  Cardiovascular:     Rate and Rhythm: Normal rate.     Pulses: Normal pulses.  Pulmonary:     Effort: Pulmonary effort is normal.  Abdominal:     Comments: Deferred  Genitourinary:    Comments: Deferred Musculoskeletal:        General: Normal range of motion.     Cervical back: Normal range of motion.  Skin:    General: Skin is warm.  Neurological:     General: No focal deficit present.     Mental Status: She is alert and oriented to person, place, and time.  Psychiatric:        Mood and Affect: Mood normal.        Behavior: Behavior normal.    Review of Systems  Constitutional:  Negative for chills and fever.  HENT:  Negative for sore throat.   Eyes:  Negative for blurred vision.  Respiratory:  Negative for cough,  sputum production, shortness of breath and wheezing.   Cardiovascular:  Negative for chest pain and palpitations.  Gastrointestinal:  Negative for heartburn and nausea.  Genitourinary:  Negative for  dysuria.  Musculoskeletal:  Negative for falls.  Skin:  Negative for itching and rash.  Neurological:  Negative for dizziness and headaches.  Endo/Heme/Allergies:        See allergy list and  Psychiatric/Behavioral:  Positive for depression and hallucinations. Negative for substance abuse and suicidal ideas. The patient is nervous/anxious. The patient does not have insomnia.    Blood pressure 122/66, pulse 97, temperature (!) 97.5 F (36.4 C), resp. rate 16, height 5' 5 (1.651 m), weight 109.6 kg, SpO2 100%. Body mass index is 40.2 kg/m.  Treatment Plan Summary: Patient has been able to sleep well for the past 2 nights.  She is feeling better.  Hallucinations are less intense.  She is less invested in her delusions.  She is not endorsing any violent thoughts towards herself or others.  She had her long-acting injectable aripiprazole  and has been taking p.o. form.  Patient's mother will visit tonight and hopefully give us  feedback.  Anticipated discharge by Friday.   1.  Aripiprazole  30 mg at bedtime for 7 days. 2.  Olanzapine  20 mg at bedtime. 3.  Abilify  maintainer 400 mg monthly. 4.  Continue to encourage oral intake of food and fluids. 5.  Continue to encourage personal hygiene and grooming. 6..  Continue to monitor mood behavior and interaction with others. 7.  Social worker will coordinate discharge and aftercare planning. 8.  Start Lexapro  10 mg nightly for mood and anxiety symptoms.  Ellouise JAYSON Azure, FNP 05/22/2024, 1:52 PM Patient ID: Patricia Branch, female   DOB: Sep 09, 2006, 19 y.o.   MRN: [id number]

## 2024-05-23 ENCOUNTER — Encounter (HOSPITAL_COMMUNITY): Payer: Self-pay

## 2024-05-23 DIAGNOSIS — F2081 Schizophreniform disorder: Secondary | ICD-10-CM | POA: Diagnosis not present

## 2024-05-23 NOTE — Progress Notes (Signed)
(  Sleep Hours) -5.5 (Any PRNs that were needed, meds refused, or side effects to meds)- none (Any disturbances and when (visitation, over night)-none (Concerns raised by the patient)- none (SI/HI/AVH)- Denies all

## 2024-05-23 NOTE — Progress Notes (Signed)
   05/23/24 0810  Psych Admission Type (Psych Patients Only)  Admission Status Involuntary  Psychosocial Assessment  Patient Complaints None  Eye Contact Fair  Facial Expression Animated  Affect Appropriate to circumstance  Speech Logical/coherent  Interaction Superficial  Motor Activity Slow  Appearance/Hygiene In scrubs  Behavior Characteristics Cooperative  Mood Pleasant  Thought Process  Coherency Disorganized  Content Preoccupation  Delusions None reported or observed  Perception WDL  Hallucination None reported or observed  Judgment Poor  Confusion None  Danger to Self  Current suicidal ideation? Denies  Agreement Not to Harm Self Yes  Description of Agreement verbal  Danger to Others  Danger to Others None reported or observed

## 2024-05-23 NOTE — Progress Notes (Signed)
 [facility] MD Progress Note  05/23/2024 2:06 PM Patricia Branch  MRN:  [id number]  Subjective:   18 year old female, lives with her family.  Background history of trauma, ADHD and anxiety disorder.  New onset psychosis in the past three months.  It has progressively gotten worse.  She hears voices of demons saying mean things to her.  She feels like she is possessed by demons.  Presented in company of her family for stabilization. Routine labs were essentially normal.  No alcohol or any psychoactive substance on board.  Chart reviewed today.  Overnight report discussed with nursing.  No acute events occurred.  Patricia Branch was seen in her room during rounds.  She reported that her mood was anxious today.  She continues to report interment AH on voices, VH of demons (saw 1 today) which she believes is an improvement.  She denies SI or AH.  Appetite and sleep are adaquate.  Energy is good.  If she continues to improve, will plan for DC Friday.   Principal Problem: Schizophreniform disorder (HCC) Diagnosis: Principal Problem:   Schizophreniform disorder (HCC) Active Problems:   Acute psychosis (HCC)  Total Time spent with patient: 30 minutes  Past Psychiatric History:  See H&P  Past Medical History: History reviewed. No pertinent past medical history. History reviewed. No pertinent surgical history. Family History: History reviewed. No pertinent family history. Family Psychiatric  History:  Family history of schizophrenia on maternal side.  Social History:  Social History   Substance and Sexual Activity  Alcohol Use No     Social History   Substance and Sexual Activity  Drug Use Never    Social History   Socioeconomic History   Marital status: Single    Spouse name: Not on file   Number of children: Not on file   Years of education: Not on file   Highest education level: Not on file  Occupational History   Not on file  Tobacco Use   Smoking status: Never   Smokeless tobacco: Not  on file  Vaping Use   Vaping status: Never Used  Substance and Sexual Activity   Alcohol use: No   Drug use: Never   Sexual activity: Not on file  Other Topics Concern   Not on file  Social History Narrative   Not on file   Social Drivers of Health   Financial Resource Strain: Not on file  Food Insecurity: No Food Insecurity (05/12/2024)   Hunger Vital Sign    Worried About Running Out of Food in the Last Year: Never true    Ran Out of Food in the Last Year: Never true  Transportation Needs: No Transportation Needs (05/12/2024)   PRAPARE - Administrator, Civil Service (Medical): No    Lack of Transportation (Non-Medical): No  Physical Activity: Not on file  Stress: Not on file  Social Connections: Not on file     Current Medications: Current Facility-Administered Medications  Medication Dose Route Frequency Provider Last Rate Last Admin   acetaminophen  (TYLENOL ) tablet 650 mg  650 mg Oral Q6H PRN Ajibola, Ene A, NP       alum & mag hydroxide-simeth (MAALOX/MYLANTA) 200-200-20 MG/5ML suspension 30 mL  30 mL Oral Q4H PRN Ajibola, Ene A, NP       ARIPiprazole  (ABILIFY ) tablet 30 mg  30 mg Oral Daily Izediuno, Vincent A, MD   30 mg at 05/23/24 0810   ARIPiprazole  ER (ABILIFY  MAINTENA) 400 MG prefilled syringe 400 mg  400 mg  Intramuscular Q28 days Izediuno, Vincent A, MD   400 mg at 05/17/24 9065   haloperidol  (HALDOL ) tablet 5 mg  5 mg Oral TID PRN Ajibola, Ene A, NP       And   diphenhydrAMINE  (BENADRYL ) capsule 50 mg  50 mg Oral TID PRN Ajibola, Ene A, NP       haloperidol  lactate (HALDOL ) injection 5 mg  5 mg Intramuscular TID PRN Ajibola, Ene A, NP   5 mg at 05/15/24 0645   And   diphenhydrAMINE  (BENADRYL ) injection 50 mg  50 mg Intramuscular TID PRN Ajibola, Ene A, NP       And   LORazepam  (ATIVAN ) injection 2 mg  2 mg Intramuscular TID PRN Ajibola, Ene A, NP       haloperidol  lactate (HALDOL ) injection 10 mg  10 mg Intramuscular TID PRN Ajibola, Ene A, NP   10 mg  at 05/13/24 0758   And   diphenhydrAMINE  (BENADRYL ) injection 50 mg  50 mg Intramuscular TID PRN Ajibola, Ene A, NP   50 mg at 05/15/24 9350   And   LORazepam  (ATIVAN ) injection 2 mg  2 mg Intramuscular TID PRN Ajibola, Ene A, NP   2 mg at 05/13/24 0758   escitalopram  (LEXAPRO ) tablet 10 mg  10 mg Oral QHS Claborn Janusz S, MD   10 mg at 05/22/24 2032   hydrOXYzine  (ATARAX ) tablet 25 mg  25 mg Oral TID PRN Ajibola, Ene A, NP   25 mg at 05/19/24 2028   magnesium  hydroxide (MILK OF MAGNESIA) suspension 30 mL  30 mL Oral Daily PRN Ajibola, Ene A, NP       OLANZapine  (ZYPREXA ) tablet 20 mg  20 mg Oral QHS Izediuno, Vincent A, MD   20 mg at 05/22/24 2032   traZODone  (DESYREL ) tablet 50 mg  50 mg Oral QHS Ntuen, Tina C, FNP   50 mg at 05/22/24 2032   Vitamin D  (Ergocalciferol ) (DRISDOL ) 1.25 MG (50000 UNIT) capsule 50,000 Units  50,000 Units Oral BID Aaditya Letizia S, MD   50,000 Units at 05/23/24 9189    Lab Results:  Results for orders placed or performed during the hospital encounter of 05/12/24 (from the past 48 hours)  VITAMIN D  25 Hydroxy (Vit-D Deficiency, Fractures)     Status: Abnormal   Collection Time: 05/21/24  6:35 PM  Result Value Ref Range   Vit D, 25-Hydroxy 21.42 (L) 30 - 100 ng/mL    Comment: (NOTE) Vitamin D  deficiency has been defined by the Institute of Medicine  and an Endocrine Society practice guideline as a level of serum 25-OH  vitamin D  less than 20 ng/mL (1,2). The Endocrine Society went on to  further define vitamin D  insufficiency as a level between 21 and 29  ng/mL (2).  1. IOM (Institute of Medicine). 2010. Dietary reference intakes for  calcium and D. [place]: [employer]. 2. Holick MF, Binkley [place], Bischoff-Ferrari HA, et al. Evaluation,  treatment, and prevention of vitamin D  deficiency: an Endocrine  Society clinical practice guideline, JCEM. 2011 Jul; 96(7): 1911-30.  Performed at [facility], [address], [place],  [place] [address]      Blood Alcohol level:  Lab Results  Component Value Date   [facility] <15 05/10/2024    Metabolic Disorder Labs: Lab Results  Component Value Date   HGBA1C 5.7 (H) 05/13/2024   MPG 117 05/13/2024   No results found for: PROLACTIN Lab Results  Component Value Date  CHOL 178 05/13/2024   TRIG 37 05/13/2024   HDL 44 05/13/2024   CHOLHDL 4.0 05/13/2024   VLDL 7 05/13/2024   LDLCALC 127 (H) 05/13/2024   LDLCALC 75 05/23/2014    Physical Findings: AIMS:  ,  ,  ,  ,  ,  ,   CIWA:    COWS:     Musculoskeletal: Strength & Muscle Tone: within normal limits Gait & Station: normal Patient leans: N/A  Psychiatric Specialty Exam:  Presentation  General Appearance and behavior:  Overweight, poor grooming, not in any distress, less perplexed.  No EPS.  Eye Contact: Good.  Speech: Spontaneous.  Normal rate, tone and volume..  Mood and Affect  Mood: anxious  Affect: Restricted and appropriate.  Thought Process  Thought Processes: Normal speed of thought.  Linear and goal directed.  Descriptions of Associations:Intact  Orientation:Full (Time, Place and Person)  Thought Content: Persecutory, paranoid delusion and delusions of being possessed by demons are less intense.  Less religious preoccupation.  No current suicidal thoughts.  No current homicidal thoughts.  No current thoughts of violence.  Hallucinations: Intermittent auditory hallucinations and VH  Sensorium  Memory: Attends to the conversation without difficulty  Judgment: Fair  Insight: Poor, believes she suffered demonic possession  Executive Functions  Concentration: Less distractible.  Attention Span: Fair  Recall: Did not assess today.  Fund of Knowledge: Fair.  Language: Good   Psychomotor Activity  No tics, tremors, or mannerisms   Physical Exam: Physical Exam ROS Blood pressure 122/66, pulse 97, temperature (!) 97.5 F (36.4 C), resp. rate 16, height 5'  5 (1.651 m), weight 109.6 kg, SpO2 100%. Body mass index is 40.2 kg/m.   Treatment Plan Summary: Patient has been able to sleep well for the past 2 nights.  She is feeling better.  Hallucinations are less intense.  She is less invested in her delusions.  She is not endorsing any violent thoughts towards herself or others.  She had her long-acting injectable aripiprazole  and has been taking p.o. form.  Patient's mother will visit tonight and hopefully give us  feedback.  Anticipated discharge by Friday.   1.  Aripiprazole  30 mg at bedtime for 7 days. 2.  Olanzapine  20 mg at bedtime. 3.  Abilify  maintainer 400 mg monthly. 4.  Continue to encourage oral intake of food and fluids. 5.  Continue to encourage personal hygiene and grooming. 6..  Continue to monitor mood behavior and interaction with others. 7.  Social worker will coordinate discharge and aftercare planning. 8.  Start Lexapro  10 mg nightly for mood and anxiety symptoms.  Starleen GORMAN Kitty, MD 05/23/2024, 2:06 PM

## 2024-05-23 NOTE — BH IP Treatment Plan (Signed)
 Interdisciplinary Treatment and Diagnostic Plan Update  05/23/2024 Time of Session: THIS IS AN UPDATE  Patricia Branch MRN: [id number]  Principal Diagnosis: Schizophreniform disorder [facility])  Secondary Diagnoses: Principal Problem:   Schizophreniform disorder (HCC) Active Problems:   Acute psychosis (HCC)   Current Medications:  Current Facility-Administered Medications  Medication Dose Route Frequency Provider Last Rate Last Admin   acetaminophen  (TYLENOL ) tablet 650 mg  650 mg Oral Q6H PRN Ajibola, Ene A, NP       alum & mag hydroxide-simeth (MAALOX/MYLANTA) 200-200-20 MG/5ML suspension 30 mL  30 mL Oral Q4H PRN Ajibola, Ene A, NP       ARIPiprazole  (ABILIFY ) tablet 30 mg  30 mg Oral Daily Izediuno, Vincent A, MD   30 mg at 05/23/24 0810   ARIPiprazole  ER (ABILIFY  MAINTENA) 400 MG prefilled syringe 400 mg  400 mg Intramuscular Q28 days Izediuno, Jerrell LABOR, MD   400 mg at 05/17/24 9065   haloperidol  (HALDOL ) tablet 5 mg  5 mg Oral TID PRN Ajibola, Ene A, NP       And   diphenhydrAMINE  (BENADRYL ) capsule 50 mg  50 mg Oral TID PRN Ajibola, Ene A, NP       haloperidol  lactate (HALDOL ) injection 5 mg  5 mg Intramuscular TID PRN Ajibola, Ene A, NP   5 mg at 05/15/24 0645   And   diphenhydrAMINE  (BENADRYL ) injection 50 mg  50 mg Intramuscular TID PRN Ajibola, Ene A, NP       And   LORazepam  (ATIVAN ) injection 2 mg  2 mg Intramuscular TID PRN Ajibola, Ene A, NP       haloperidol  lactate (HALDOL ) injection 10 mg  10 mg Intramuscular TID PRN Ajibola, Ene A, NP   10 mg at 05/13/24 0758   And   diphenhydrAMINE  (BENADRYL ) injection 50 mg  50 mg Intramuscular TID PRN Ajibola, Ene A, NP   50 mg at 05/15/24 9350   And   LORazepam  (ATIVAN ) injection 2 mg  2 mg Intramuscular TID PRN Ajibola, Ene A, NP   2 mg at 05/13/24 0758   escitalopram  (LEXAPRO ) tablet 10 mg  10 mg Oral QHS Parker, Alvin S, MD   10 mg at 05/22/24 2032   hydrOXYzine  (ATARAX ) tablet 25 mg  25 mg Oral TID PRN Ajibola, Ene A, NP   25 mg  at 05/19/24 2028   magnesium  hydroxide (MILK OF MAGNESIA) suspension 30 mL  30 mL Oral Daily PRN Ajibola, Ene A, NP       OLANZapine  (ZYPREXA ) tablet 20 mg  20 mg Oral QHS Izediuno, Vincent A, MD   20 mg at 05/22/24 2032   traZODone  (DESYREL ) tablet 50 mg  50 mg Oral QHS Ntuen, Tina C, FNP   50 mg at 05/22/24 2032   Vitamin D  (Ergocalciferol ) (DRISDOL ) 1.25 MG (50000 UNIT) capsule 50,000 Units  50,000 Units Oral BID Parker, Alvin S, MD   50,000 Units at 05/23/24 9189   PTA Medications: No medications prior to admission.    Patient Stressors: Other: hallucinations, delusions    Patient Strengths: Average or above average intelligence  Supportive family/friends   Treatment Modalities: Medication Management, Group therapy, Case management,  1 to 1 session with clinician, Psychoeducation, Recreational therapy.   Physician Treatment Plan for Primary Diagnosis: Schizophreniform disorder [facility]) Long Term Goal(s): Improvement in symptoms so as ready for discharge   Short Term Goals: Ability to identify changes in lifestyle to reduce recurrence of condition will improve Ability to verbalize feelings will improve  Ability to disclose and discuss suicidal ideas Ability to demonstrate self-control will improve Ability to identify and develop effective coping behaviors will improve Ability to maintain clinical measurements within normal limits will improve Compliance with prescribed medications will improve Ability to identify triggers associated with substance abuse/mental health issues will improve  Medication Management: Evaluate patient's response, side effects, and tolerance of medication regimen.  Therapeutic Interventions: 1 to 1 sessions, Unit Group sessions and Medication administration.  Evaluation of Outcomes: Progressing  Physician Treatment Plan for Secondary Diagnosis: Principal Problem:   Schizophreniform disorder (HCC) Active Problems:   Acute psychosis (HCC)  Long Term  Goal(s): Improvement in symptoms so as ready for discharge   Short Term Goals: Ability to identify changes in lifestyle to reduce recurrence of condition will improve Ability to verbalize feelings will improve Ability to disclose and discuss suicidal ideas Ability to demonstrate self-control will improve Ability to identify and develop effective coping behaviors will improve Ability to maintain clinical measurements within normal limits will improve Compliance with prescribed medications will improve Ability to identify triggers associated with substance abuse/mental health issues will improve     Medication Management: Evaluate patient's response, side effects, and tolerance of medication regimen.  Therapeutic Interventions: 1 to 1 sessions, Unit Group sessions and Medication administration.  Evaluation of Outcomes: Progressing   RN Treatment Plan for Primary Diagnosis: Schizophreniform disorder (HCC) Long Term Goal(s): Knowledge of disease and therapeutic regimen to maintain health will improve  Short Term Goals: Ability to verbalize feelings will improve, Ability to disclose and discuss suicidal ideas, and Ability to identify and develop effective coping behaviors will improve  Medication Management: RN will administer medications as ordered by provider, will assess and evaluate patient's response and provide education to patient for prescribed medication. RN will report any adverse and/or side effects to prescribing provider.  Therapeutic Interventions: 1 on 1 counseling sessions, Psychoeducation, Medication administration, Evaluate responses to treatment, Monitor vital signs and CBGs as ordered, Perform/monitor CIWA, COWS, AIMS and Fall Risk screenings as ordered, Perform wound care treatments as ordered.  Evaluation of Outcomes: Progressing   LCSW Treatment Plan for Primary Diagnosis: Schizophreniform disorder [facility]) Long Term Goal(s): Safe transition to appropriate next level of  care at discharge, Engage patient in therapeutic group addressing interpersonal concerns.  Short Term Goals: Engage patient in aftercare planning with referrals and resources, Increase ability to appropriately verbalize feelings, Facilitate acceptance of mental health diagnosis and concerns, and Increase skills for wellness and recovery  Therapeutic Interventions: Assess for all discharge needs, 1 to 1 time with Social worker, Explore available resources and support systems, Assess for adequacy in community support network, Educate family and significant other(s) on suicide prevention, Complete Psychosocial Assessment, Interpersonal group therapy.  Evaluation of Outcomes: Progressing   Progress in Treatment: Attending groups: Yes. Participating in groups: Yes Taking medication as prescribed: Yes. Toleration medication: Yes. Family/Significant other contact made: Yes, contacted Fay Bagg (mom) [phone]  Patient understands diagnosis: Yes. Discussing patient identified problems/goals with staff: Yes. Medical problems stabilized or resolved: Yes. Denies suicidal/homicidal ideation: Yes. Issues/concerns per patient self-inventory: No.   Patient Goals:  Not really anything I want to work on   Discharge Plan or Barriers: Pt plans to return home after discharged.   Reason for Continuation of Hospitalization: Hallucinations Medication stabilization   Estimated Length of Stay: 2-3 days  Last 3 [place] Suicide Severity Risk Score: Flowsheet Row [facility] ([facility]24 [facility]  C-SSRS RISK CATEGORY High Risk High Risk    Last PHQ 2/9 Scores:     No data to display          Scribe for Treatment Team: Edell Mesenbrink V Ysmael Hires, LCSWA 05/23/2024 12:47 PM

## 2024-05-24 DIAGNOSIS — F23 Brief psychotic disorder: Secondary | ICD-10-CM | POA: Diagnosis not present

## 2024-05-24 NOTE — Progress Notes (Addendum)
  [facility] Adult Case Management Discharge Plan :  Will you be returning to the same living situation after discharge:  Yes,  patient lives with mom, Marda Breidenbach (mom) [phone] At discharge, do you have transportation home?: Yes,  patient's mom, Kiylee Thoreson, [phone], will pick up patient on Friday, 05/25/2024 at 1 PM Do you have the ability to pay for your medications: Yes,  patient has insurance  Release of information consent forms completed and in the chart;  Patient's signature needed at discharge.  Patient to Follow up at:  Follow-up Information     [facility] Go on 05/24/2024.   Specialty: Professional Counselor Why: Please go to this provider on 05/24/24 at 9:00 am for an assessment, to obtain therapy services. You may also go on Monday through Friday, from 9 am to 1 pm for an assessment. Contact information: [address] [place]  [address] [place] [address] [phone]         [facility] Follow up on 06/15/2024.   Why: You have an appointment for medication management services on 06/15/24 at 4:20 pm .  This will be a Virtual appointment. Contact information: [address] [place] [address] [phone]                 Next level of care provider has access to [facility] Link:no  Safety Planning and Suicide Prevention discussed: Yes,  Kaneesha Constantino (mom) [phone]  Has patient been referred to the Quitline?: Patient does not use tobacco/nicotine products  Patient has been referred for addiction treatment: No known substance use disorder.   Januel Doolan O Wallis Spizzirri, LCSWA 05/24/2024, 3:45 PM

## 2024-05-24 NOTE — Progress Notes (Signed)
   05/24/24 0900  Psych Admission Type (Psych Patients Only)  Admission Status Involuntary  Psychosocial Assessment  Patient Complaints None  Eye Contact Fair  Facial Expression Flat  Affect Appropriate to circumstance  Speech Logical/coherent  Interaction Minimal  Motor Activity Slow  Appearance/Hygiene In scrubs  Behavior Characteristics Cooperative  Mood Pleasant  Thought Process  Coherency Disorganized  Content Preoccupation  Delusions None reported or observed  Perception WDL  Hallucination None reported or observed  Judgment Poor  Confusion None  Danger to Self  Current suicidal ideation? Denies  Danger to Others  Danger to Others None reported or observed

## 2024-05-24 NOTE — Plan of Care (Signed)
   Problem: Education: Goal: Emotional status will improve Outcome: Progressing Goal: Mental status will improve Outcome: Progressing

## 2024-05-24 NOTE — Progress Notes (Signed)
 [facility] MD Progress Note  05/24/2024 4:08 PM Patricia Branch  MRN:  [id number]  Reason for admission:   18 year old female, lives with her family.  Background history of trauma, ADHD and anxiety disorder.  New onset psychosis in the past three months.  It has progressively gotten worse.  She hears voices of demons saying mean things to her.  She feels like she is possessed by demons.  Presented in company of her family for stabilization. Routine labs were essentially normal.  No alcohol or any psychoactive substance on board.  Chart reviewed today.  Overnight report discussed with nursing.  No acute events occurred.  Today's assessment notes: On assessment today, the pt reports that her mood is euthymic, improved since admission, and stable. Denies feeling down, depressed, or sad.  Denies experiencing auditory and visual hallucination today.  Reports last auditory and visual hallucination was 1 week ago.  Made patient aware she saw this provider on Tuesday and she was experiencing auditory hallucination and visual hallucination and visual examination during that assessment.  However, patient continues to deny hallucinations today, showing some disorganization in her thought process and thought content. Reports that anxiety symptoms are at manageable level.  Sleep is stable. Appetite is stable.  Concentration is improving.  Energy level is adequate. Denies having any suicidal thoughts. Denies having any suicidal intent and plan.  Denies having any HI.  Denies having psychotic symptoms.   Denies having side effects to current psychiatric medications. If she continues to improve, will plan for DC Friday 05/25/2024.  Discussed discharge planning:  How to identify the signs of impending crisis, use of internal coping strategies, reaching out to friends and family that can help navigate a crisis, and a list of mental health professionals and agencies to call. Further to follow up on her mental health  appointments and her PCP appointments.      Principal Problem: Schizophreniform disorder (HCC) Diagnosis: Principal Problem:   Schizophreniform disorder (HCC) Active Problems:   Acute psychosis (HCC)  Total Time spent with patient: 45 minutes  Past Psychiatric History:  See H&P  Past Medical History: History reviewed. No pertinent past medical history. History reviewed. No pertinent surgical history. Family History: History reviewed. No pertinent family history. Family Psychiatric  History:  Family history of schizophrenia on maternal side.  Social History:  Social History   Substance and Sexual Activity  Alcohol Use No     Social History   Substance and Sexual Activity  Drug Use Never    Social History   Socioeconomic History   Marital status: Single    Spouse name: Not on file   Number of children: Not on file   Years of education: Not on file   Highest education level: Not on file  Occupational History   Not on file  Tobacco Use   Smoking status: Never   Smokeless tobacco: Not on file  Vaping Use   Vaping status: Never Used  Substance and Sexual Activity   Alcohol use: No   Drug use: Never   Sexual activity: Not on file  Other Topics Concern   Not on file  Social History Narrative   Not on file   Social Drivers of Health   Financial Resource Strain: Not on file  Food Insecurity: No Food Insecurity (05/12/2024)   Hunger Vital Sign    Worried About Running Out of Food in the Last Year: Never true    Ran Out of Food in the Last Year: Never true  Transportation Needs: No Transportation Needs (05/12/2024)   PRAPARE - Administrator, Civil Service (Medical): No    Lack of Transportation (Non-Medical): No  Physical Activity: Not on file  Stress: Not on file  Social Connections: Not on file   Current Medications: Current Facility-Administered Medications  Medication Dose Route Frequency Provider Last Rate Last Admin   acetaminophen  (TYLENOL )  tablet 650 mg  650 mg Oral Q6H PRN Ajibola, Ene A, NP       alum & mag hydroxide-simeth (MAALOX/MYLANTA) 200-200-20 MG/5ML suspension 30 mL  30 mL Oral Q4H PRN Ajibola, Ene A, NP       ARIPiprazole  (ABILIFY ) tablet 30 mg  30 mg Oral Daily Izediuno, Jerrell LABOR, MD   30 mg at 05/24/24 0853   ARIPiprazole  ER (ABILIFY  MAINTENA) 400 MG prefilled syringe 400 mg  400 mg Intramuscular Q28 days Izediuno, Jerrell LABOR, MD   400 mg at 05/17/24 0934   haloperidol  (HALDOL ) tablet 5 mg  5 mg Oral TID PRN Ajibola, Ene A, NP       And   diphenhydrAMINE  (BENADRYL ) capsule 50 mg  50 mg Oral TID PRN Ajibola, Ene A, NP       haloperidol  lactate (HALDOL ) injection 5 mg  5 mg Intramuscular TID PRN Ajibola, Ene A, NP   5 mg at 05/15/24 0645   And   diphenhydrAMINE  (BENADRYL ) injection 50 mg  50 mg Intramuscular TID PRN Ajibola, Ene A, NP       And   LORazepam  (ATIVAN ) injection 2 mg  2 mg Intramuscular TID PRN Ajibola, Ene A, NP       haloperidol  lactate (HALDOL ) injection 10 mg  10 mg Intramuscular TID PRN Ajibola, Ene A, NP   10 mg at 05/13/24 0758   And   diphenhydrAMINE  (BENADRYL ) injection 50 mg  50 mg Intramuscular TID PRN Ajibola, Ene A, NP   50 mg at 05/15/24 9350   And   LORazepam  (ATIVAN ) injection 2 mg  2 mg Intramuscular TID PRN Ajibola, Ene A, NP   2 mg at 05/13/24 0758   escitalopram  (LEXAPRO ) tablet 10 mg  10 mg Oral QHS Parker, Alvin S, MD   10 mg at 05/23/24 2041   hydrOXYzine  (ATARAX ) tablet 25 mg  25 mg Oral TID PRN Ajibola, Ene A, NP   25 mg at 05/19/24 2028   magnesium  hydroxide (MILK OF MAGNESIA) suspension 30 mL  30 mL Oral Daily PRN Ajibola, Ene A, NP       OLANZapine  (ZYPREXA ) tablet 20 mg  20 mg Oral QHS Izediuno, Vincent A, MD   20 mg at 05/23/24 2040   traZODone  (DESYREL ) tablet 50 mg  50 mg Oral QHS Delano Frate C, FNP   50 mg at 05/23/24 2040   Vitamin D  (Ergocalciferol ) (DRISDOL ) 1.25 MG (50000 UNIT) capsule 50,000 Units  50,000 Units Oral BID Parker, Alvin S, MD   50,000 Units at 05/24/24  9146   Lab Results:  No results found for this or any previous visit (from the past 48 hours).  Blood Alcohol level:  Lab Results  Component Value Date   [facility] <15 05/10/2024    Metabolic Disorder Labs: Lab Results  Component Value Date   HGBA1C 5.7 (H) 05/13/2024   MPG 117 05/13/2024   No results found for: PROLACTIN Lab Results  Component Value Date   CHOL 178 05/13/2024   TRIG 37 05/13/2024   HDL 44 05/13/2024   CHOLHDL 4.0 05/13/2024   VLDL 7  05/13/2024   LDLCALC 127 (H) 05/13/2024   LDLCALC 75 05/23/2014    Physical Findings: AIMS:  ,  ,  ,  ,  ,  ,   CIWA:    COWS:     Musculoskeletal: Strength & Muscle Tone: within normal limits Gait & Station: normal Patient leans: N/A  Psychiatric Specialty Exam:  Presentation  General Appearance and behavior:  Overweight, poor grooming, not in any distress, less perplexed.  No EPS.  Eye Contact: Good.  Speech: Spontaneous.  Normal rate, tone and volume..  Mood and Affect  Mood: anxious  Affect: Restricted and appropriate.  Thought Process  Thought Processes: Normal speed of thought.  Linear and goal directed.  Descriptions of Associations:Intact  Orientation:Full (Time, Place and Person)  Thought Content: Persecutory, paranoid delusion and delusions of being possessed by demons are less intense.  Less religious preoccupation.  No current suicidal thoughts.  No current homicidal thoughts.  No current thoughts of violence.  Hallucinations: Intermittent auditory hallucinations and VH  Sensorium  Memory: Attends to the conversation without difficulty  Judgment: Fair  Insight: Poor, believes she suffered demonic possession  Executive Functions  Concentration: Less distractible.  Attention Span: Fair  Recall: Did not assess today.  Fund of Knowledge: Fair.  Language: Good  Psychomotor Activity  No tics, tremors, or mannerisms  Physical Exam: Physical Exam Vitals and nursing  note reviewed.  Constitutional:      Appearance: She is obese.  HENT:     Head: Normocephalic.     Right Ear: External ear normal.     Left Ear: External ear normal.     Nose: Nose normal.     Mouth/Throat:     Mouth: Mucous membranes are moist.  Eyes:     Extraocular Movements: Extraocular movements intact.  Cardiovascular:     Rate and Rhythm: Tachycardia present.  Pulmonary:     Effort: Pulmonary effort is normal.  Abdominal:     Comments: Deferred  Genitourinary:    Comments: Deferred Musculoskeletal:        General: Normal range of motion.     Cervical back: Normal range of motion.  Skin:    General: Skin is warm.  Neurological:     General: No focal deficit present.     Mental Status: She is alert and oriented to person, place, and time.  Psychiatric:        Mood and Affect: Mood normal.        Behavior: Behavior normal.    Review of Systems  Constitutional:  Negative for chills and fever.  HENT:  Negative for sore throat.   Eyes:  Negative for blurred vision.  Respiratory:  Negative for cough, sputum production, shortness of breath and wheezing.   Cardiovascular:  Negative for chest pain and palpitations.  Gastrointestinal:  Negative for heartburn.  Genitourinary:  Negative for dysuria.  Musculoskeletal:  Negative for falls.  Skin:  Negative for itching and rash.  Neurological:  Negative for dizziness and headaches.  Endo/Heme/Allergies:        See allergy listing  Psychiatric/Behavioral:  Positive for depression. Negative for hallucinations, substance abuse and suicidal ideas. The patient is nervous/anxious. The patient does not have insomnia.    Blood pressure 122/66, pulse 97, temperature (!) 97.5 F (36.4 C), resp. rate 16, height 5' 5 (1.651 m), weight 109.6 kg, SpO2 100%. Body mass index is 40.2 kg/m.  Treatment Plan Summary: Patient has been able to sleep well for the past 2 nights.  She is feeling better.  Hallucinations are less intense.  She is  less invested in her delusions.  She is not endorsing any violent thoughts towards herself or others.  She had her long-acting injectable aripiprazole  and has been taking p.o. form.  Patient's mother will visit tonight and hopefully give us  feedback.  Anticipated discharge by Friday.   1.  Aripiprazole  30 mg at bedtime for 7 days, end 05/25/24. 2.  Olanzapine  20 mg at bedtime. 3.  Abilify  maintainer 400 mg monthly. 4.  Continue to encourage oral intake of food and fluids. 5.  Continue to encourage personal hygiene and grooming. 6..  Continue to monitor mood behavior and interaction with others. 7.  Social worker will coordinate discharge and aftercare planning. 8.  Start Lexapro  10 mg nightly for mood and anxiety symptoms.  Ellouise JAYSON Azure, FNP 05/24/2024, 4:08 PM Patient ID: Patricia Branch, female   DOB: December 08, 2005, 18 y.o.   MRN: [id number]

## 2024-05-24 NOTE — Plan of Care (Signed)

## 2024-05-24 NOTE — Progress Notes (Signed)
   05/24/24 2300  Psych Admission Type (Psych Patients Only)  Admission Status Involuntary  Psychosocial Assessment  Patient Complaints None  Eye Contact Fair  Facial Expression Flat  Affect Appropriate to circumstance  Speech Logical/coherent  Interaction Minimal  Motor Activity Slow  Appearance/Hygiene In scrubs  Behavior Characteristics Cooperative  Mood Pleasant  Aggressive Behavior  Effect No apparent injury  Thought Process  Coherency Disorganized  Content Preoccupation  Delusions None reported or observed  Perception WDL  Hallucination None reported or observed  Judgment Poor  Confusion None  Danger to Self  Current suicidal ideation? Denies

## 2024-05-24 NOTE — Progress Notes (Signed)
 Collateral contact - Deztinee Lohmeyer (mom) [phone]  Mom said she will pick up patient on Friday, 8/22 at 1 PM.   Finneas Mathe, LCSWA 05/24/2024

## 2024-05-24 NOTE — Progress Notes (Signed)
(  Sleep Hours) - 7.75 (Any PRNs that were needed, meds refused, or side effects to meds)- none (Any disturbances and when (visitation, over night)- none (Concerns raised by the patient)- none (SI/HI/AVH)- denies

## 2024-05-24 NOTE — [facility] Group Notes (Signed)
 Psychoeducational Group Note  Date:  05/24/2024 Time:  2034  Group Topic/Focus:  Wrap-Up Group:   The focus of this group is to help patients review their daily goal of treatment and discuss progress on daily workbooks.  Participation Level: Did Not Attend  Participation Quality:  Not Applicable  Affect:  Not Applicable  Cognitive:  Not Applicable  Insight:  Not Applicable  Engagement in Group: Not Applicable  Additional Comments:  The patient did not attend group this evening.   Analyce Tavares S 05/24/2024, 8:34 PM

## 2024-05-25 DIAGNOSIS — F23 Brief psychotic disorder: Secondary | ICD-10-CM | POA: Diagnosis not present

## 2024-05-25 MED ORDER — ARIPIPRAZOLE ER 400 MG IM PRSY: 400.0000 mg | PREFILLED_SYRINGE | INTRAMUSCULAR | 0 refills | Status: AC

## 2024-05-25 MED ORDER — TRAZODONE HCL 50 MG PO TABS: 50.0000 mg | ORAL_TABLET | Freq: Every day | ORAL | 0 refills | Status: AC

## 2024-05-25 MED ORDER — ESCITALOPRAM OXALATE 10 MG PO TABS: 10.0000 mg | ORAL_TABLET | Freq: Every day | ORAL | 0 refills | Status: AC

## 2024-05-25 MED ORDER — OLANZAPINE 20 MG PO TABS: 20.0000 mg | ORAL_TABLET | Freq: Every day | ORAL | 0 refills | Status: AC

## 2024-05-25 MED ORDER — VITAMIN D (ERGOCALCIFEROL) 1.25 MG (50000 UNIT) PO CAPS: 50000.0000 [IU] | ORAL_CAPSULE | ORAL | 0 refills | Status: AC

## 2024-05-25 MED ORDER — HYDROXYZINE HCL 25 MG PO TABS: 25.0000 mg | ORAL_TABLET | Freq: Three times a day (TID) | ORAL | 0 refills | Status: AC | PRN

## 2024-05-25 NOTE — Plan of Care (Signed)

## 2024-05-25 NOTE — [facility] Suicide Risk Assessment (Signed)
 Suicide Risk Assessment  Discharge Assessment    [facility] Discharge Suicide Risk Assessment   Principal Problem: Schizophreniform disorder [facility]) Discharge Diagnoses: Principal Problem:   Schizophreniform disorder (HCC) Active Problems:   Acute psychosis (HCC)  Reason for admission: 18 y/o female with no prior psychiatric history reported with new onset of psychosis for the past month who presented to [facility] with her Patricia Branch) on 05/10/24.   Total Time spent with patient: 45 minutes  Musculoskeletal: Strength & Muscle Tone: within normal limits Gait & Station: normal Patient leans: N/A  Psychiatric Specialty Exam  Presentation  General Appearance:  Casual  Eye Contact: Good  Speech: Clear and Coherent  Speech Volume: Normal  Handedness: Right  Mood and Affect  Mood: Euthymic  Duration of Depression Symptoms: -- (uta)  Affect: Congruent  Thought Process  Thought Processes: Coherent (Coherent with baseline disorganization)  Descriptions of Associations:Intact  Orientation:Full (Time, Place and Person)  Thought Content:Logical  History of Schizophrenia/Schizoaffective disorder:Yes  Duration of Psychotic Symptoms:Less than six months  Hallucinations:Hallucinations: -- (Denies today) Description of Auditory Hallucinations: Denies  Ideas of Reference:None  Suicidal Thoughts:Suicidal Thoughts: No SI Passive Intent and/or Plan: -- (Denies)  Homicidal Thoughts:Homicidal Thoughts: No  Sensorium  Memory: Immediate Fair  Judgment: Fair  Insight: Fair  Executive Functions  Concentration: Good  Attention Span: Good  Recall: Fair  Fund of Knowledge: Fair  Language: Fair  Psychomotor Activity  Psychomotor Activity: Psychomotor Activity: Normal  Assets  Assets: Communication Skills; Physical Health; Resilience  Sleep  Sleep: Sleep: Good  Estimated Sleeping Duration (Last 24 Hours): 5.50 hours  Physical Exam: Physical  Exam Vitals and nursing note reviewed.  Constitutional:      General: She is not in acute distress.    Appearance: She is obese. She is not ill-appearing.  HENT:     Head: Normocephalic.     Right Ear: External ear normal.     Left Ear: External ear normal.     Nose: Nose normal.     Mouth/Throat:     Mouth: Mucous membranes are moist.     Pharynx: Oropharynx is clear.  Eyes:     Extraocular Movements: Extraocular movements intact.  Cardiovascular:     Pulses: Normal pulses.  Pulmonary:     Effort: Pulmonary effort is normal. No respiratory distress.  Abdominal:     Comments: Deferred   Genitourinary:    Comments: Deferred  Musculoskeletal:        General: Normal range of motion.  Neurological:     General: No focal deficit present.     Mental Status: She is alert and oriented to person, place, and time.  Psychiatric:        Mood and Affect: Mood normal.        Behavior: Behavior normal.    Review of Systems  Constitutional:  Negative for chills and fever.  HENT:  Negative for sore throat.   Eyes:  Negative for blurred vision.  Respiratory:  Negative for cough, sputum production, shortness of breath and wheezing.   Cardiovascular:  Negative for chest pain and palpitations.  Gastrointestinal:  Negative for abdominal pain, constipation, diarrhea, heartburn, nausea and vomiting.  Genitourinary:  Negative for dysuria.  Musculoskeletal:  Negative for falls.  Skin:  Negative for itching and rash.  Neurological:  Negative for dizziness and headaches.  Endo/Heme/Allergies:        See allergy listing  Psychiatric/Behavioral:  Positive for depression (Stable with medication) and hallucinations (At baseline). Negative for substance abuse  and suicidal ideas. The patient is nervous/anxious (Improved with medication). The patient does not have insomnia.    Blood pressure 122/66, pulse 97, temperature (!) 97.5 F (36.4 C), resp. rate 16, height 5' 5 (1.651 m), weight 109.6 kg,  SpO2 100%. Body mass index is 40.2 kg/m.  Mental Status Per Nursing Assessment::   On Admission:  Suicidal ideation indicated by patient  Demographic Factors:  Adolescent or young adult, Low socioeconomic status, and Unemployed  Loss Factors: Financial problems/change in socioeconomic status  Historical Factors: NA  Risk Reduction Factors:   Living with another person, especially a relative, Positive social support, Positive therapeutic relationship, and Positive coping skills or problem solving skills  Continued Clinical Symptoms:  Depression:   Recent sense of peace/wellbeing More than one psychiatric diagnosis Previous Psychiatric Diagnoses and Treatments  Cognitive Features That Contribute To Risk:  Polarized thinking    Suicide Risk:  Mild:  Suicidal ideation of limited frequency, intensity, duration, and specificity.  There are no identifiable plans, no associated intent, mild dysphoria and related symptoms, good self-control (both objective and subjective assessment), few other risk factors, and identifiable protective factors, including available and accessible social support.   Follow-up Information     [facility] Go on 05/24/2024.   Specialty: Professional Counselor Why: Please go to this provider on 05/24/24 at 9:00 am for an assessment, to obtain therapy services. You may also go on Monday through Friday, from 9 am to 1 pm for an assessment. Contact information: [address] [place]  [address] [place] [address] [phone]         [facility] Follow up on 06/15/2024.   Why: You have an appointment for medication management services on 06/15/24 at 4:20 pm .  This will be a Virtual appointment. Contact information: [address] [place] [address] [phone]                Plan Of Care/Follow-up recommendations:  Discharge Recommendations:    The patient is being discharged with her family.   Patient  is to take her discharge medications as ordered. ?See follow up above.   We recommend that she participates in individual therapy to target uncontrollable agitation and substance abuse.    We recommend that she participates in therapy to target the conflict with her family, to improve communication skills and conflict resolution skills. ?patient is to initiate/implement a contingency based behavioral model to address patient's behavior.   We recommend that she gets AIMS scale, height, weight, blood pressure, fasting lipid panel, fasting blood sugar in three months from discharge if she's on atypical antipsychotics.    Patient will benefit from monitoring of recurrent suicidal ideation since patient is on antidepressant medication.   The patient should abstain from all illicit substances and alcohol.   If the patient's symptoms worsen or do not continue to improve or if the patient becomes actively suicidal or homicidal then it is recommended that the patient return to the closest hospital emergency room or call 911 for further evaluation and treatment. National Suicide Prevention Lifeline 1800-SUICIDE or 2037316574.   Please follow up with your primary medical doctor for all other medical needs.    The patient has been educated on the possible side effects to medications and she/her guardian is to contact a medical professional and inform outpatient provider of any new side effects of medication.   She is to take regular diet and activity as tolerated. ?Will benefit from moderate daily  exercise.   Patient was educated about removing/locking any firearms, medications or dangerous products from the home.   Activity:  As tolerated   Diet:  Regular Diet   Ellouise JAYSON Azure, FNP 05/25/2024, 10:26 AM

## 2024-05-25 NOTE — Progress Notes (Signed)
(  Sleep Hours) -5.5 (Any PRNs that were needed, meds refused, or side effects to meds)- none (Any disturbances and when (visitation, over night)-none (Concerns raised by the patient)- none (SI/HI/AVH)-denied

## 2024-05-25 NOTE — Progress Notes (Signed)
 Patient ID: Patricia Branch, female   DOB: 18-Mar-2006, 18 y.o.   MRN: [id number]  Patient discharged home with family. Satisfied with property returned. Discharge paperwork provided with education. Verbalized understanding. Denies SI/HI/AVH.

## 2024-05-25 NOTE — Progress Notes (Signed)
   05/25/24 0807  Psych Admission Type (Psych Patients Only)  Admission Status Involuntary  Psychosocial Assessment  Patient Complaints None  Eye Contact Fair  Facial Expression Animated  Affect Appropriate to circumstance  Speech Logical/coherent  Interaction Assertive  Motor Activity Slow  Appearance/Hygiene In scrubs  Behavior Characteristics Cooperative;Appropriate to situation  Mood Pleasant  Thought Process  Coherency WDL  Content WDL  Delusions None reported or observed  Perception WDL  Hallucination None reported or observed  Judgment Poor  Confusion None  Danger to Self  Current suicidal ideation? Denies  Agreement Not to Harm Self Yes  Description of Agreement Verbal  Danger to Others  Danger to Others None reported or observed

## 2024-05-25 NOTE — Discharge Summary (Signed)
 Physician Discharge Summary Note  Patient:  Patricia Branch is an 18 y.o., female MRN:  [id number] DOB:  Jan 10, 2006 Patient phone:  [phone] (home)  Patient address:   [address] [place] [facility] [address],   Total Time spent with patient: 45 minutes  Date of Admission:  05/12/2024 Date of Discharge:   05/25/2024  Reason for Admission:  18 y/o female with no prior psychiatric history reported with new onset of psychosis for the past month who presented to [facility] with her Tinlee Navarrette) on 05/10/24.   Principal Problem: Schizophreniform disorder [facility]) Discharge Diagnoses: Principal Problem:   Schizophreniform disorder (HCC) Active Problems:   Acute psychosis (HCC)  Past Psychiatric History: Denies prior hospitalizations Denies prior suicide attempts Seeing Provider through [facility] on review of medication fills and was prescribed Klonopin, D-amphetamine 15 mg ER, Lexapro  20 mg qdaily and Latuda 60 mg qdaily Patient denies prior suicide attempts Denies history of violence  Past Medical History: History reviewed. No pertinent past medical history. History reviewed. No pertinent surgical history. Family History: History reviewed. No pertinent family history. Family Psychiatric  History: See H&P Social History:  Social History   Substance and Sexual Activity  Alcohol Use No     Social History   Substance and Sexual Activity  Drug Use Never    Social History   Socioeconomic History   Marital status: Single    Spouse name: Not on file   Number of children: Not on file   Years of education: Not on file   Highest education level: Not on file  Occupational History   Not on file  Tobacco Use   Smoking status: Never   Smokeless tobacco: Not on file  Vaping Use   Vaping status: Never Used  Substance and Sexual Activity   Alcohol use: No   Drug use: Never   Sexual activity: Not on file  Other Topics Concern   Not on file  Social History Narrative   Not on file    Social Drivers of Health   Financial Resource Strain: Not on file  Food Insecurity: No Food Insecurity (05/12/2024)   Hunger Vital Sign    Worried About Running Out of Food in the Last Year: Never true    Ran Out of Food in the Last Year: Never true  Transportation Needs: No Transportation Needs (05/12/2024)   PRAPARE - Administrator, Civil Service (Medical): No    Lack of Transportation (Non-Medical): No  Physical Activity: Not on file  Stress: Not on file  Social Connections: Not on file   Hospital Course:  During the patient's hospitalization, patient had extensive initial psychiatric evaluation, and follow-up psychiatric evaluations every day.  Psychiatric diagnoses provided upon initial assessment:  Diagnosis:  Principal Problem:   Acute psychosis (HCC)  Patient's psychiatric medications were adjusted on admission:  Start Abilify  10 mg qdaily for first onset psychosis, if patient does not have favorable response will switch to Risperdal -trazodone  50 mg at bedtime-PRN for insomnia -hydroxyzine  25 mg TID-PRN for anxiety  During the hospitalization, other adjustments were made to the patient's psychiatric medication regimen:  Lexapro  10 mg po daily was initiated for depression Abilify  400 mg LAI was initiated on 05/17/24 Q monthly for psychosis Abilify  PO dose was discontinued after 7 days of initiation of Abilify  LAI.  Patient's care was discussed during the interdisciplinary team meeting every day during the hospitalization.  The patient denies having side effects to prescribed psychiatric medication.  Gradually, patient started adjusting  to milieu. The patient was evaluated each day by a clinical provider to ascertain response to treatment. Improvement was noted by the patient's report of decreasing symptoms, improved sleep and appetite, affect, medication tolerance, behavior, and participation in unit programming.  Patient was asked each day to complete a self  inventory noting mood, mental status, pain, new symptoms, anxiety and concerns.    Symptoms were reported as significantly decreased or resolved completely by discharge.   On day of discharge, the patient reports that their mood is stable. The patient denied having suicidal thoughts for more than 48 hours prior to discharge.  Patient denies having homicidal thoughts.  Patient denies having auditory hallucinations.  Patient denies any visual hallucinations or other symptoms of psychosis. The patient was motivated to continue taking medication with a goal of continued improvement in mental health.   The patient reports their target psychiatric symptoms of psychosis responded well to the psychiatric medications, and the patient reports overall benefit other psychiatric hospitalization. Supportive psychotherapy was provided to the patient. The patient also participated in regular group therapy while hospitalized. Coping skills, problem solving as well as relaxation therapies were also part of the unit programming.  Labs were reviewed with the patient, and abnormal results were discussed with the patient.  The patient is able to verbalize their individual safety plan to this provider.  # It is recommended to the patient to continue psychiatric medications as prescribed, after discharge from the hospital.    # It is recommended to the patient to follow up with your outpatient psychiatric provider and PCP.  # It was discussed with the patient, the impact of alcohol, drugs, tobacco have been there overall psychiatric and medical wellbeing, and total abstinence from substance use was recommended the patient.ed.  # Prescriptions provided or sent directly to preferred pharmacy at discharge. Patient agreeable to plan. Given opportunity to ask questions. Appears to feel comfortable with discharge.    # In the event of worsening symptoms, the patient is instructed to call the crisis hotline, 911 and or go to  the nearest ED for appropriate evaluation and treatment of symptoms. To follow-up with primary care provider for other medical issues, concerns and or health care needs  # Patient was discharged to home with a plan to follow up as noted below.   Physical Findings: AIMS:  , ,  ,  ,  ,  ,   CIWA:    COWS:     Musculoskeletal: Strength & Muscle Tone: within normal limits Gait & Station: normal Patient leans: N/A  Psychiatric Specialty Exam:  Presentation  General Appearance:  Casual  Eye Contact: Good  Speech: Clear and Coherent  Speech Volume: Normal  Handedness: Right  Mood and Affect  Mood: Euthymic  Affect: Congruent  Thought Process  Thought Processes: Coherent (Coherent with baseline disorganization)  Descriptions of Associations:Intact  Orientation:Full (Time, Place and Person)  Thought Content:Logical  History of Schizophrenia/Schizoaffective disorder:Yes  Duration of Psychotic Symptoms:Less than six months  Hallucinations:Hallucinations: -- (Denies today) Description of Auditory Hallucinations: Denies  Ideas of Reference:None  Suicidal Thoughts:Suicidal Thoughts: No SI Passive Intent and/or Plan: -- (Denies)  Homicidal Thoughts:Homicidal Thoughts: No  Sensorium  Memory: Immediate Fair  Judgment: Fair  Insight: Fair  Executive Functions  Concentration: Good  Attention Span: Good  Recall: Fair  Fund of Knowledge: Fair  Language: Fair  Psychomotor Activity  Psychomotor Activity: Psychomotor Activity: Normal   Assets  Assets: Communication Skills; Physical Health; Resilience  Sleep  Sleep:  Sleep: Good  Estimated Sleeping Duration (Last 24 Hours): 5.50 hours  Physical Exam: Physical Exam Vitals and nursing note reviewed.  Constitutional:      General: She is not in acute distress.    Appearance: She is not ill-appearing.  HENT:     Head: Normocephalic.     Right Ear: External ear normal.     Nose: Nose  normal.     Mouth/Throat:     Mouth: Mucous membranes are moist.     Pharynx: Oropharynx is clear.  Eyes:     Extraocular Movements: Extraocular movements intact.  Cardiovascular:     Rate and Rhythm: Normal rate.     Pulses: Normal pulses.  Pulmonary:     Effort: Pulmonary effort is normal.  Abdominal:     Comments: Deferred  Genitourinary:    Comments: Deferred  Musculoskeletal:        General: Normal range of motion.     Cervical back: Normal range of motion.  Skin:    General: Skin is warm.  Neurological:     General: No focal deficit present.     Mental Status: She is alert and oriented to person, place, and time.  Psychiatric:        Mood and Affect: Mood normal.        Behavior: Behavior normal.    Review of Systems  Constitutional:  Negative for chills and fever.  HENT:  Negative for sore throat.   Eyes:  Negative for blurred vision.  Respiratory:  Negative for cough, sputum production, shortness of breath and wheezing.   Cardiovascular:  Negative for chest pain and palpitations.  Gastrointestinal:  Negative for abdominal pain, constipation, diarrhea, heartburn, nausea and vomiting.  Genitourinary:  Negative for dysuria.  Musculoskeletal:  Negative for falls.  Skin:  Negative for itching and rash.  Neurological:  Negative for dizziness and headaches.  Endo/Heme/Allergies:        See allergy listing  Psychiatric/Behavioral:  Positive for depression (Stable with medication) and hallucinations. Negative for substance abuse and suicidal ideas. The patient is nervous/anxious (Improved with medication). The patient does not have insomnia.    Blood pressure 122/66, pulse 97, temperature (!) 97.5 F (36.4 C), resp. rate 16, height 5' 5 (1.651 m), weight 109.6 kg, SpO2 100%. Body mass index is 40.2 kg/m.  Social History   Tobacco Use  Smoking Status Never  Smokeless Tobacco Not on file   Tobacco Cessation:  N/A, patient does not currently use tobacco  products  Blood Alcohol level:  Lab Results  Component Value Date   [facility] <15 05/10/2024   Metabolic Disorder Labs:  Lab Results  Component Value Date   HGBA1C 5.7 (H) 05/13/2024   MPG 117 05/13/2024   No results found for: PROLACTIN Lab Results  Component Value Date   CHOL 178 05/13/2024   TRIG 37 05/13/2024   HDL 44 05/13/2024   CHOLHDL 4.0 05/13/2024   VLDL 7 05/13/2024   LDLCALC 127 (H) 05/13/2024   LDLCALC 75 05/23/2014    See Psychiatric Specialty Exam and Suicide Risk Assessment completed by Attending Physician prior to discharge.  Discharge destination:  Home  Is patient on multiple antipsychotic therapies at discharge:  Yes,   Do you recommend tapering to monotherapy for antipsychotics?  No   Has Patient had three or more failed trials of antipsychotic monotherapy by history:  No  Recommended Plan for Multiple Antipsychotic Therapies: Patient's medications are in the process of a cross-taper;  medications include:  Olanzapine  20 mg po at bedtime for residual psychosis  Discharge Instructions     Increase activity slowly   Complete by: As directed       Allergies as of 05/25/2024   No Known Allergies      Medication List     TAKE these medications      Indication  ARIPiprazole  ER 400 MG Prsy prefilled syringe Commonly known as: ABILIFY  MAINTENA Inject 400 mg into the muscle every 28 (twenty-eight) days. Start taking on: June 14, 2024  Indication: Manic-Depression   escitalopram  10 MG tablet Commonly known as: LEXAPRO  Take 1 tablet (10 mg total) by mouth at bedtime.  Indication: Generalized Anxiety Disorder, Major Depressive Disorder   hydrOXYzine  25 MG tablet Commonly known as: ATARAX  Take 1 tablet (25 mg total) by mouth 3 (three) times daily as needed for anxiety.  Indication: Feeling Anxious   OLANZapine  20 MG tablet Commonly known as: ZYPREXA  Take 1 tablet (20 mg total) by mouth at bedtime.  Indication: Psychotic Depressive  Illness   traZODone  50 MG tablet Commonly known as: DESYREL  Take 1 tablet (50 mg total) by mouth at bedtime.  Indication: Trouble Sleeping   Vitamin D  (Ergocalciferol ) 1.25 MG (50000 UNIT) Caps capsule Commonly known as: DRISDOL  Take 1 capsule (50,000 Units total) by mouth every 7 (seven) days.  Indication: Vitamin D  Deficiency        Follow-up Information     [place] Go on 05/24/2024.   Specialty: Professional Counselor Why: Please go to this provider on 05/24/24 at 9:00 am for an assessment, to obtain therapy services. You may also go on Monday through Friday, from 9 am to 1 pm for an assessment. Contact information: [address] [place]  [address] [place] [address] [phone]         [facility] Follow up on 06/15/2024.   Why: You have an appointment for medication management services on 06/15/24 at 4:20 pm .  This will be a Virtual appointment. Contact information: [address] [place] [address] [phone]                Follow-up recommendations:   Discharge Recommendations:    The patient is being discharged with her family.   Patient is to take her discharge medications as ordered. ?See follow up above.   We recommend that she participates in individual therapy to target uncontrollable agitation and substance abuse.    We recommend that she participates in therapy to target the conflict with her family, to improve communication skills and conflict resolution skills. ?patient is to initiate/implement a contingency based behavioral model to address patient's behavior.   We recommend that she gets AIMS scale, height, weight, blood pressure, fasting lipid panel, fasting blood sugar in three months from discharge if she's on atypical antipsychotics.    Patient will benefit from monitoring of recurrent suicidal ideation since patient is on antidepressant medication.   The patient should abstain from all  illicit substances and alcohol.   If the patient's symptoms worsen or do not continue to improve or if the patient becomes actively suicidal or homicidal then it is recommended that the patient return to the closest hospital emergency room or call 911 for further evaluation and treatment. National Suicide Prevention Lifeline 1800-SUICIDE or 320 066 6047.   Please follow up with your primary medical doctor for all other medical needs.    The patient has been educated on the possible side effects to medications and she/her guardian  is to contact a medical professional and inform outpatient provider of any new side effects of medication.   She is to take regular diet and activity as tolerated. ?Will benefit from moderate daily exercise.   Patient was educated about removing/locking any firearms, medications or dangerous products from the home.    Activity:  As tolerated   Diet:  Regular Diet  Signed: Ellouise JAYSON Azure, FNP 05/25/2024, 12:39 PM

## 2024-06-02 NOTE — Progress Notes (Signed)
 Diagnosis is Schizophreniform disorder [F20.81 (ICD-10-CM)
